# Patient Record
Sex: Female | Born: 2000 | Race: White | Hispanic: No | Marital: Single
Health system: Northeastern US, Academic
[De-identification: ages and names within clinical notes are randomized; demographics above are authoritative.]

## PROBLEM LIST (undated history)

## (undated) DIAGNOSIS — F909 Attention-deficit hyperactivity disorder, unspecified type: Secondary | ICD-10-CM

## (undated) DIAGNOSIS — F4310 Post-traumatic stress disorder, unspecified: Secondary | ICD-10-CM

## (undated) DIAGNOSIS — Z789 Other specified health status: Secondary | ICD-10-CM

## (undated) DIAGNOSIS — F1090 Alcohol use, unspecified, uncomplicated: Secondary | ICD-10-CM

## (undated) DIAGNOSIS — F319 Bipolar disorder, unspecified: Secondary | ICD-10-CM

---
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
## 2022-05-04 ENCOUNTER — Encounter
Admit: 2022-05-04 | Payer: PRIVATE HEALTH INSURANCE | Attending: Student in an Organized Health Care Education/Training Program | Primary: Student in an Organized Health Care Education/Training Program

## 2022-05-04 ENCOUNTER — Ambulatory Visit
Admit: 2022-05-04 | Payer: Managed Care (Private) | Attending: Student in an Organized Health Care Education/Training Program | Primary: Student in an Organized Health Care Education/Training Program

## 2022-05-04 MED ORDER — ESCITALOPRAM 20 MG TABLET
20 | ORAL | 2.00 refills | 90.00000 days | Status: AC
Start: 2022-05-04 — End: ?

## 2022-05-04 MED ORDER — BUPROPION HCL XL 150 MG 24 HR TABLET, EXTENDED RELEASE
150 | ORAL | 2.00 refills | 90.00000 days | Status: AC
Start: 2022-05-04 — End: ?

## 2022-05-04 MED ORDER — VYVANSE 60 MG CAPSULE
60 | ORAL | 1.00 refills | 30.00000 days | Status: AC
Start: 2022-05-04 — End: 2023-01-08

## 2022-05-04 MED ORDER — ARIPIPRAZOLE 5 MG TABLET
5 | ORAL | 1.00 refills | 30.00000 days | Status: AC
Start: 2022-05-04 — End: 2023-01-08

## 2022-05-05 ENCOUNTER — Inpatient Hospital Stay
Admit: 2022-05-05 | Discharge: 2022-05-05 | Payer: PRIVATE HEALTH INSURANCE | Primary: Student in an Organized Health Care Education/Training Program

## 2022-05-05 DIAGNOSIS — Z13220 Encounter for screening for lipoid disorders: Secondary | ICD-10-CM

## 2022-05-05 DIAGNOSIS — Z0189 Encounter for other specified special examinations: Secondary | ICD-10-CM

## 2022-05-05 DIAGNOSIS — Z0000 Encounter for general adult medical examination without abnormal findings: Secondary | ICD-10-CM

## 2022-05-05 DIAGNOSIS — Z8639 Personal history of other endocrine, nutritional and metabolic disease: Secondary | ICD-10-CM

## 2022-05-05 DIAGNOSIS — E612 Magnesium deficiency: Secondary | ICD-10-CM

## 2022-05-05 LAB — COMPREHENSIVE METABOLIC PANEL
BKR A/G RATIO: 1.1
BKR ALANINE AMINOTRANSFERASE (ALT): 31 U/L (ref 12–78)
BKR ALBUMIN: 3.9 g/dL (ref 3.4–5.0)
BKR ALKALINE PHOSPHATASE: 49 U/L — ABNORMAL LOW (ref 50–335)
BKR ANION GAP: 6 (ref 5–18)
BKR ASPARTATE AMINOTRANSFERASE (AST): 69 U/L — ABNORMAL HIGH (ref 5–37)
BKR AST/ALT RATIO: 2.2
BKR BILIRUBIN TOTAL: 0.6 mg/dL (ref 0.0–1.0)
BKR BLOOD UREA NITROGEN: 16 mg/dL (ref 8–25)
BKR BUN / CREAT RATIO: 16.2 % (ref 8.0–25.0)
BKR CALCIUM: 9.1 mg/dL (ref 9.1–10.3)
BKR CHLORIDE: 109 mmol/L — ABNORMAL HIGH (ref 95–115)
BKR CO2: 27 mmol/L (ref 21–32)
BKR CREATININE: 0.99 mg/dL (ref 0.50–1.30)
BKR EGFR, CREATININE (CKD-EPI 2021): >60 mL/min/{1.73_m2} (ref >=60)
BKR GLUCOSE: 76 mg/dL (ref 70–100)
BKR OSMOLALITY CALCULATION: 283 mosm/kg (ref 275–295)
BKR POTASSIUM: 4.3 mmol/L (ref 3.5–5.1)
BKR PROTEIN TOTAL: 7.6 g/dL (ref 6.4–8.2)
BKR SODIUM: 142 mmol/L (ref 136–145)
BKR WAM BASOPHIL ABSOLUTE COUNT.: 283 mOsm/kg (ref 275–295)

## 2022-05-05 LAB — LIPID PANEL
BKR CHOLESTEROL/HDL RATIO: 2.4 (ref 1.0–3.9)
BKR CHOLESTEROL: 179 mg/dL — ABNORMAL HIGH (ref 125–175)
BKR HDL CHOLESTEROL: 75 mg/dL (ref 40–90)
BKR LDL CHOLESTEROL SAMPSON CALCULATED: 87 mg/dL (ref 20–130)
BKR TRIGLYCERIDES: 95 mg/dL (ref 30–200)

## 2022-05-05 LAB — CBC WITH AUTO DIFFERENTIAL
BKR GLOBULIN: 1.16 x 1000/??L (ref 0.60–3.70)
BKR WAM ABSOLUTE IMMATURE GRANULOCYTES.: 0.02 x 1000/??L (ref 0.00–0.30)
BKR WAM ABSOLUTE LYMPHOCYTE COUNT.: 1.16 x 1000/ÂµL (ref 0.60–3.70)
BKR WAM ABSOLUTE NRBC (2 DEC): 0 x 1000/??L (ref 0.00–1.00)
BKR WAM ANALYZER ANC: 3.07 x 1000/ÂµL (ref 2.00–7.60)
BKR WAM BASOPHILS: 0.4 % (ref 0.0–1.4)
BKR WAM EOSINOPHIL ABSOLUTE COUNT.: 0.04 x 1000/??L (ref 0.00–1.00)
BKR WAM EOSINOPHILS: 0.8 % (ref 0.0–5.0)
BKR WAM HEMATOCRIT (2 DEC): 41.3 % (ref 35.00–45.00)
BKR WAM HEMOGLOBIN: 14 g/dL (ref 11.7–15.5)
BKR WAM IMMATURE GRANULOCYTES: 0.4 % (ref 0.0–1.0)
BKR WAM LYMPHOCYTES: 24.5 % (ref 17.0–50.0)
BKR WAM MCH (PG): 34.4 pg — ABNORMAL HIGH (ref 27.0–33.0)
BKR WAM MCHC: 33.9 g/dL (ref 31.0–36.0)
BKR WAM MCV: 101.5 fL — ABNORMAL HIGH (ref 80.0–100.0)
BKR WAM MONOCYTE ABSOLUTE COUNT.: 0.42 x 1000/??L (ref 0.00–1.00)
BKR WAM MONOCYTES: 8.9 % (ref 4.0–12.0)
BKR WAM MPV: 10.3 fL (ref 8.0–12.0)
BKR WAM NEUTROPHILS: 65 % (ref 39.0–72.0)
BKR WAM NUCLEATED RED BLOOD CELLS: 0 % (ref 0.0–1.0)
BKR WAM PLATELETS: 248 x1000/ÂµL (ref 150–420)
BKR WAM RDW-CV: 11.7 % (ref 11.0–15.0)
BKR WAM RED BLOOD CELL COUNT.: 4.07 M/??L (ref 4.00–6.00)
BKR WAM WHITE BLOOD CELL COUNT: 4.7 x1000/??L (ref 4.0–11.0)

## 2022-05-05 LAB — IRON AND TIBC
BKR IRON SATURATION (SCCCT BH GH LM): 41 % (ref 11–46)
BKR IRON: 140 ug/dL (ref 40–150)
BKR TOTAL IRON BINDING CAPACITY (SCCCT  BH GH LM): 344 ug/dL — ABNORMAL HIGH (ref 250–450)

## 2022-05-05 LAB — MAGNESIUM: BKR MAGNESIUM: 2.2 mg/dL (ref 1.4–2.2)

## 2022-05-05 LAB — FERRITIN: BKR FERRITIN: 54 ng/mL (ref 8–252)

## 2022-05-05 LAB — HEMOGLOBIN A1C: BKR HEMOGLOBIN A1C: 4.9 %

## 2022-05-05 LAB — TSH W/REFLEX TO FT4     (BH GH LMW Q YH): BKR THYROID STIMULATING HORMONE: 2.17 ??IU/mL (ref 0.358–3.740)

## 2022-05-09 ENCOUNTER — Encounter
Admit: 2022-05-09 | Payer: PRIVATE HEALTH INSURANCE | Attending: Student in an Organized Health Care Education/Training Program | Primary: Student in an Organized Health Care Education/Training Program

## 2022-05-09 NOTE — Other
Elevated AST. Repeat in 2 months. Otherwise normal labs.

## 2022-06-23 ENCOUNTER — Other Ambulatory Visit: Payer: Self-pay

## 2022-06-23 ENCOUNTER — Observation Stay
Admission: EM | Admit: 2022-06-23 | Discharge: 2022-06-24 | Disposition: A | Discharge status: Home / Self Care | Payer: BC Managed Care – PPO | Attending: Internal Medicine | Admitting: Internal Medicine

## 2022-06-23 DIAGNOSIS — F4310 Post-traumatic stress disorder, unspecified: Secondary | ICD-10-CM | POA: Diagnosis present

## 2022-06-23 DIAGNOSIS — F1090 Alcohol use, unspecified, uncomplicated: Secondary | ICD-10-CM | POA: Diagnosis not present

## 2022-06-23 DIAGNOSIS — T50901A Poisoning by unspecified drugs, medicaments and biological substances, accidental (unintentional), initial encounter: Secondary | ICD-10-CM | POA: Diagnosis present

## 2022-06-23 DIAGNOSIS — Z79899 Other long term (current) drug therapy: Secondary | ICD-10-CM | POA: Diagnosis not present

## 2022-06-23 DIAGNOSIS — F411 Generalized anxiety disorder: Secondary | ICD-10-CM | POA: Diagnosis not present

## 2022-06-23 DIAGNOSIS — Z1152 Encounter for screening for COVID-19: Secondary | ICD-10-CM | POA: Insufficient documentation

## 2022-06-23 DIAGNOSIS — F909 Attention-deficit hyperactivity disorder, unspecified type: Secondary | ICD-10-CM | POA: Diagnosis not present

## 2022-06-23 DIAGNOSIS — T43222A Poisoning by selective serotonin reuptake inhibitors, intentional self-harm, initial encounter: Secondary | ICD-10-CM | POA: Diagnosis present

## 2022-06-23 DIAGNOSIS — T43291A Poisoning by other antidepressants, accidental (unintentional), initial encounter: Secondary | ICD-10-CM | POA: Diagnosis not present

## 2022-06-23 DIAGNOSIS — T50902A Poisoning by unspecified drugs, medicaments and biological substances, intentional self-harm, initial encounter: Secondary | ICD-10-CM | POA: Insufficient documentation

## 2022-06-23 DIAGNOSIS — F319 Bipolar disorder, unspecified: Secondary | ICD-10-CM | POA: Diagnosis not present

## 2022-06-23 DIAGNOSIS — Y906 Blood alcohol level of 120-199 mg/100 ml: Secondary | ICD-10-CM | POA: Insufficient documentation

## 2022-06-23 DIAGNOSIS — E876 Hypokalemia: Secondary | ICD-10-CM | POA: Diagnosis not present

## 2022-06-23 DIAGNOSIS — Z789 Other specified health status: Secondary | ICD-10-CM | POA: Diagnosis present

## 2022-06-23 DIAGNOSIS — T43292A Poisoning by other antidepressants, intentional self-harm, initial encounter: Secondary | ICD-10-CM | POA: Insufficient documentation

## 2022-06-23 DIAGNOSIS — F332 Major depressive disorder, recurrent severe without psychotic features: Secondary | ICD-10-CM | POA: Diagnosis not present

## 2022-06-23 DIAGNOSIS — F3132 Bipolar disorder, current episode depressed, moderate: Secondary | ICD-10-CM | POA: Diagnosis present

## 2022-06-23 DIAGNOSIS — F314 Bipolar disorder, current episode depressed, severe, without psychotic features: Secondary | ICD-10-CM | POA: Diagnosis present

## 2022-06-23 HISTORY — DX: Attention-deficit hyperactivity disorder, unspecified type: F90.9

## 2022-06-23 HISTORY — DX: Bipolar disorder, unspecified: F31.9

## 2022-06-23 HISTORY — DX: Other specified health status: Z78.9

## 2022-06-23 HISTORY — DX: Alcohol use, unspecified, uncomplicated: F10.90

## 2022-06-23 HISTORY — DX: Post-traumatic stress disorder, unspecified: F43.10

## 2022-06-23 LAB — URINE DRUG SCREEN, QUALITATIVE (ARMC ONLY)
Amphetamines, Ur Screen: NOT DETECTED
Barbiturates, Ur Screen: NOT DETECTED
Benzodiazepine, Ur Scrn: NOT DETECTED
Cannabinoid 50 Ng, Ur ~~LOC~~: NOT DETECTED
Cocaine Metabolite,Ur ~~LOC~~: NOT DETECTED
MDMA (Ecstasy)Ur Screen: NOT DETECTED
Methadone Scn, Ur: NOT DETECTED
Opiate, Ur Screen: NOT DETECTED
Phencyclidine (PCP) Ur S: NOT DETECTED
Tricyclic, Ur Screen: NOT DETECTED

## 2022-06-23 LAB — URINALYSIS, ROUTINE W REFLEX MICROSCOPIC
Bilirubin Urine: NEGATIVE
Glucose, UA: NEGATIVE mg/dL
Hgb urine dipstick: NEGATIVE
Ketones, ur: NEGATIVE mg/dL
Leukocytes,Ua: NEGATIVE
Nitrite: NEGATIVE
Protein, ur: NEGATIVE mg/dL
Specific Gravity, Urine: 1.001 — ABNORMAL LOW (ref 1.005–1.030)
pH: 6 (ref 5.0–8.0)

## 2022-06-23 LAB — ACETAMINOPHEN LEVEL
Acetaminophen (Tylenol), Serum: <10 ug/mL — ABNORMAL LOW (ref 10–30)
Acetaminophen (Tylenol), Serum: <10 ug/mL — ABNORMAL LOW (ref 10–30)

## 2022-06-23 LAB — COMPREHENSIVE METABOLIC PANEL
ALT: 14 U/L (ref 0–44)
AST: 26 U/L (ref 15–41)
Albumin: 4.4 g/dL (ref 3.5–5.0)
Alkaline Phosphatase: 40 U/L (ref 38–126)
Anion gap: 10 (ref 5–15)
BUN: 13 mg/dL (ref 6–20)
CO2: 22 mmol/L (ref 22–32)
Calcium: 9.2 mg/dL (ref 8.9–10.3)
Chloride: 107 mmol/L (ref 98–111)
Creatinine, Ser: 0.89 mg/dL (ref 0.44–1.00)
GFR, Estimated: >60 mL/min (ref >60)
Glucose, Bld: 94 mg/dL (ref 70–99)
Potassium: 3.4 mmol/L — ABNORMAL LOW (ref 3.5–5.1)
Sodium: 139 mmol/L (ref 135–145)
Total Bilirubin: 0.4 mg/dL (ref 0.3–1.2)
Total Protein: 7.8 g/dL (ref 6.5–8.1)

## 2022-06-23 LAB — SALICYLATE LEVEL: Salicylate Lvl: <7 mg/dL — ABNORMAL LOW (ref 7.0–30.0)

## 2022-06-23 LAB — MAGNESIUM: Magnesium: 2.2 mg/dL (ref 1.7–2.4)

## 2022-06-23 LAB — CBC WITH DIFFERENTIAL/PLATELET
Abs Immature Granulocytes: 0.04 10*3/uL (ref 0.00–0.07)
Basophils Absolute: 0.1 10*3/uL (ref 0.0–0.1)
Basophils Relative: 1 %
Eosinophils Absolute: 0.1 10*3/uL (ref 0.0–0.5)
Eosinophils Relative: 1 %
HCT: 40.3 % (ref 36.0–46.0)
Hemoglobin: 13.6 g/dL (ref 12.0–15.0)
Immature Granulocytes: 1 %
Lymphocytes Relative: 27 %
Lymphs Abs: 2.2 10*3/uL (ref 0.7–4.0)
MCH: 34.2 pg — ABNORMAL HIGH (ref 26.0–34.0)
MCHC: 33.7 g/dL (ref 30.0–36.0)
MCV: 101.3 fL — ABNORMAL HIGH (ref 80.0–100.0)
Monocytes Absolute: 0.6 10*3/uL (ref 0.1–1.0)
Monocytes Relative: 7 %
Neutro Abs: 5.1 10*3/uL (ref 1.7–7.7)
Neutrophils Relative %: 63 %
Platelets: 263 10*3/uL (ref 150–400)
RBC: 3.98 MIL/uL (ref 3.87–5.11)
RDW: 11.6 % (ref 11.5–15.5)
WBC: 8 10*3/uL (ref 4.0–10.5)
nRBC: 0 % (ref 0.0–0.2)

## 2022-06-23 LAB — ETHANOL: Alcohol, Ethyl (B): 191 mg/dL — ABNORMAL HIGH (ref <10)

## 2022-06-23 LAB — RESP PANEL BY RT-PCR (FLU A&B, COVID) ARPGX2
Influenza A by PCR: NEGATIVE
Influenza B by PCR: NEGATIVE
SARS Coronavirus 2 by RT PCR: NEGATIVE

## 2022-06-23 LAB — POC URINE PREG, ED: Preg Test, Ur: NEGATIVE

## 2022-06-23 MED ORDER — INFLUENZA VAC SPLIT QUAD 0.5 ML IM SUSY: 0.5000 mL | PREFILLED_SYRINGE | INTRAMUSCULAR | Status: DC

## 2022-06-23 MED ORDER — ONDANSETRON HCL 4 MG/2ML IJ SOLN: 4.0000 mg | Freq: Three times a day (TID) | INTRAMUSCULAR | Status: DC | PRN

## 2022-06-23 MED ORDER — LORAZEPAM 2 MG/ML IJ SOLN: 1.0000 mg | INTRAMUSCULAR | Status: DC | PRN

## 2022-06-23 MED ORDER — IBUPROFEN 400 MG PO TABS: 200.0000 mg | ORAL_TABLET | Freq: Four times a day (QID) | ORAL | Status: DC | PRN

## 2022-06-23 MED ORDER — LORAZEPAM 2 MG/ML IJ SOLN: 0.0000 mg | Freq: Four times a day (QID) | INTRAMUSCULAR | Status: DC

## 2022-06-23 MED ORDER — THIAMINE HCL 100 MG/ML IJ SOLN: 100.0000 mg | Freq: Every day | INTRAMUSCULAR | Status: DC

## 2022-06-23 MED ORDER — CHARCOAL ACTIVATED PO LIQD
50.0000 g | Freq: Once | ORAL | Status: AC
  Administered 2022-06-23: 50 g via ORAL
  Filled 2022-06-23: qty 240

## 2022-06-23 MED ORDER — ADULT MULTIVITAMIN W/MINERALS CH
1.0000 | ORAL_TABLET | Freq: Every day | ORAL | Status: DC
  Administered 2022-06-24: 1 via ORAL
  Filled 2022-06-23: qty 1

## 2022-06-23 MED ORDER — POTASSIUM CHLORIDE CRYS ER 20 MEQ PO TBCR
60.0000 meq | EXTENDED_RELEASE_TABLET | Freq: Once | ORAL | Status: AC
  Administered 2022-06-23: 60 meq via ORAL
  Filled 2022-06-23: qty 3

## 2022-06-23 MED ORDER — LORAZEPAM 2 MG/ML IJ SOLN: 0.0000 mg | Freq: Two times a day (BID) | INTRAMUSCULAR | Status: DC

## 2022-06-23 MED ORDER — THIAMINE MONONITRATE 100 MG PO TABS
100.0000 mg | ORAL_TABLET | Freq: Every day | ORAL | Status: DC
  Administered 2022-06-23 – 2022-06-24 (×2): 100 mg via ORAL
  Filled 2022-06-23 (×2): qty 1

## 2022-06-23 MED ORDER — FOLIC ACID 1 MG PO TABS
1.0000 mg | ORAL_TABLET | Freq: Every day | ORAL | Status: DC
  Administered 2022-06-24: 1 mg via ORAL
  Filled 2022-06-23: qty 1

## 2022-06-23 MED ORDER — LACTATED RINGERS IV BOLUS
1000.0000 mL | Freq: Once | INTRAVENOUS | Status: AC
  Administered 2022-06-23: 1000 mL via INTRAVENOUS

## 2022-06-23 MED ORDER — LORAZEPAM 1 MG PO TABS: 1.0000 mg | ORAL_TABLET | ORAL | Status: DC | PRN

## 2022-06-23 MED ORDER — ENOXAPARIN SODIUM 40 MG/0.4ML IJ SOSY: 40.0000 mg | PREFILLED_SYRINGE | INTRAMUSCULAR | Status: DC

## 2022-06-23 NOTE — ED Provider Notes (Signed)
[facility] Provider Note    Event Date/Time   First MD Initiated Contact with Patient 06/23/22 0327     (approximate)   History   Suicide Attempt   HPI  Denise Jordan is a 21 y.o. female who presents to the ED for evaluation of Suicide Attempt   Patient presents to the ED for evaluation of a suicide attempt.  She reports having a lot of psychosocial stressors, she is at [facility].  She reports taking 10-15 tablets of 150 mg Wellbutrin as well as 5 tablets of 20 mg Lexapro.  Also reports some ethanol without other recreational drugs.   Physical Exam   Triage Vital Signs: ED Triage Vitals [06/23/22 0330]  Enc Vitals Group     BP      Pulse      Resp      Temp      Temp src      SpO2 99 %     Weight      Height      Head Circumference      Peak Flow      Pain Score      Pain Loc      Pain Edu?      Excl. in GC?     Most recent vital signs: Vitals:   06/23/22 0430 06/23/22 0500  BP: 121/75 (!) 108/59  Pulse: 92 (!) 103  Resp: 15 17  SpO2: 99% 99%    General: Awake, no distress.  Flat affect, somewhat sleepy but awake and conversational.  Pleasant. CV:  Good peripheral perfusion.  Resp:  Normal effort.  Abd:  No distention.  MSK:  No deformity noted.  Neuro:  No focal deficits appreciated. Other:     ED Results / Procedures / Treatments   Labs (all labs ordered are listed, but only abnormal results are displayed) Labs Reviewed  COMPREHENSIVE METABOLIC PANEL - Abnormal; Notable for the following components:      Result Value   Potassium 3.4 (*)    All other components within normal limits  ETHANOL - Abnormal; Notable for the following components:   Alcohol, Ethyl (B) 191 (*)    All other components within normal limits  CBC WITH DIFFERENTIAL/PLATELET - Abnormal; Notable for the following components:   MCV 101.3 (*)    MCH 34.2 (*)    All other components within normal limits  SALICYLATE LEVEL - Abnormal; Notable for the  following components:   Salicylate Lvl <7.0 (*)    All other components within normal limits  ACETAMINOPHEN LEVEL - Abnormal; Notable for the following components:   Acetaminophen (Tylenol), Serum <10 (*)    All other components within normal limits  URINALYSIS, ROUTINE W REFLEX MICROSCOPIC - Abnormal; Notable for the following components:   Color, Urine COLORLESS (*)    APPearance CLEAR (*)    Specific Gravity, Urine 1.001 (*)    All other components within normal limits  RESP PANEL BY RT-PCR (FLU A&B, COVID) ARPGX2  URINE DRUG SCREEN, QUALITATIVE ([facility] ONLY)  MAGNESIUM  POC URINE PREG, ED    EKG Sinus rhythm with a rate of 100 bpm.  Normal axis and intervals.  Poor quality lateral leads.  No clear signs of acute ischemia.  RADIOLOGY   Official radiology report(s): No results found.  PROCEDURES and INTERVENTIONS:  .Critical Care  Performed by: Delton Prairie, MD Authorized by: Delton Prairie, MD   Critical care provider statement:    Critical care time (  minutes):  30   Critical care time was exclusive of:  Separately billable procedures and treating other patients   Critical care was necessary to treat or prevent imminent or life-threatening deterioration of the following conditions:  Toxidrome   Critical care was time spent personally by me on the following activities:  Development of treatment plan with patient or surrogate, discussions with consultants, evaluation of patient's response to treatment, examination of patient, ordering and review of laboratory studies, ordering and review of radiographic studies, ordering and performing treatments and interventions, pulse oximetry, re-evaluation of patient's condition and review of old charts   Medications  charcoal activated (NO SORBITOL) (ACTIDOSE-AQUA) suspension 50 g (50 g Oral Given 06/23/22 0356)  lactated ringers bolus 1,000 mL (0 mLs Intravenous Stopped 06/23/22 0529)     IMPRESSION / MDM / [place] / ED  COURSE  I reviewed the triage vital signs and the nursing notes.  Differential diagnosis includes, but is not limited to, polysubstance abuse, overdose, malingering, assault  {Patient presents with symptoms of an acute illness or injury that is potentially life-threatening.  21 year old girl presents with continued suicidality after intentionally overdosing on Wellbutrin and SSRI.  She is requiring medical admission while under IVC for continued observation considering this overdose.  She is somewhat sleepy for me, but looks okay overall.  No other particular toxidromes, distress, signs of serotonin syndrome.  Blood work is reassuring with normal CBC, metabolic panel.  Ethanol is noted to be slightly elevated, UDS is normal.  We consulted poison control who recommends 24-hour observation, so we will consult with medicine. Placed under IVC      FINAL CLINICAL IMPRESSION(S) / ED DIAGNOSES   Final diagnoses:  Intentional overdose, initial encounter [facility])     Rx / DC Orders   ED Discharge Orders     None        Note:  This document was prepared using Dragon voice recognition software and may include unintentional dictation errors.   Vladimir Crofts, MD 06/23/22 [phone]

## 2022-06-23 NOTE — ED Triage Notes (Signed)
Patient arrived by [facility] from [place] for suicide attempt of over dose 15-150 mg Wellbutrin 5-20 mg Lexapro. Patient states she has been under a lot of stress and was out with friends at [place] parties and became upset.  Patient states she had a couple of drinks at the parties.

## 2022-06-23 NOTE — H&P (Signed)
History and Physical    [name] [id number] DOB: 09-05-2000 DOA: 06/23/2022  Referring MD/NP/PA:   PCP: Pcp, No   Patient coming from:  The patient is coming from home.  At baseline, pt is independent for most of ADL.        Chief Complaint: overdose  HPI: Denise Jordan is a 21 y.o. female with medical history significant of ADHD, bipolar I disorder ([place]), GAD (generalized anxiety disorder, MDD (major depressive disorder), PTSD (post-traumatic stress disorder), alcohol use, who presents with overdose.  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  She has tremendous psychosocial stress recently. She reports taking 10-15 tablets of 150 mg Wellbutrin as well as 5 tablets of 20 mg Lexapro.  She denies suicidal homicidal ideations to me.  Patient does not have chest pain, cough, shortness breath.  No nausea vomiting, diarrhea or abdominal pain.  No symptoms of UTI.  She states that she drinks vodka, 3 times per week, 1 to 2 glasses of vodka each time.   Data reviewed independently and ED Course: pt was found to have WBC 8.0, negative UDS, negative pregnancy test, Tylenol level less than 0000000, salicylate level less than 7, negative urinalysis, negative COVID PCR, alcohol level 191, potassium 3.4, GFR> 60, magnesium 2.2.  Blood pressure 101/52, heart rate 107, RR 16, oxygen saturation 95% on room air.  Patient is placed on telemetry bed for observation.  Psychiatry NP, Caroline Sauger is consulted.   EKG: I have personally reviewed.  Sinus rhythm, QTc 431, nonspecific T wave change.  Repeated EKG showed QTc 440.   Review of Systems:   General: no fevers, chills, no body weight gain, fatigue HEENT: no blurry vision, hearing changes or sore throat Respiratory: no dyspnea, coughing, wheezing CV: no chest pain, no palpitations GI: no nausea, vomiting, abdominal pain, diarrhea, constipation GU: no dysuria, burning on urination, increased urinary frequency, hematuria  Ext: no  leg edema Neuro: no unilateral weakness, numbness, or tingling, no vision change or hearing loss Skin: no rash, no skin tear. MSK: No muscle spasm, no deformity, no limitation of range of movement in spin Heme: No easy bruising.  Travel history: No recent long distant travel. Psychiatry:  denies suicidal homicidal ideations currently.   Allergy: No Known Allergies  Past Medical History:  Diagnosis Date   ADHD    Alcohol use    Bipolar disorder ([place])    PTSD (post-traumatic stress disorder)     History reviewed. No pertinent surgical history.  Social History:  reports that she has never smoked. She has never used smokeless tobacco. She reports current alcohol use. She reports that she does not use drugs.  Family History:  Family History  Problem Relation Age of Onset   Stroke Mother    Kidney cancer Father      Prior to Admission medications   Medication Sig Start Date End Date Taking? Authorizing Provider  amphetamine-dextroamphetamine (ADDERALL XR) 30 MG 24 hr capsule Take 30 mg by mouth every morning. 05/11/22  Yes [provider]  ARIPiprazole (ABILIFY) 5 MG tablet Take 5 mg by mouth daily. 06/19/22  Yes [provider]  buPROPion (WELLBUTRIN XL) 150 MG 24 hr tablet Take 150 mg by mouth daily. 06/19/22  Yes [provider]  escitalopram (LEXAPRO) 20 MG tablet Take 20 mg by mouth daily. 06/20/22  Yes [provider]    Physical Exam: Vitals:   06/23/22 1200 06/23/22 1230 06/23/22 1300 06/23/22 1534  BP: 101/61 (!) 101/59  101/66 112/64  Pulse: 79 98 82 92  Resp: 15 19 12 16   Temp:    98.5 F (36.9 C)  TempSrc:    Oral  SpO2: 95% 98% 99% 99%  Weight:      Height:       General: Not in acute distress HEENT:       Eyes: PERRL, EOMI, no scleral icterus.       ENT: No discharge from the ears and nose, no pharynx injection, no tonsillar enlargement.        Neck: No JVD, no bruit, no mass felt. Heme: No neck lymph node  enlargement. Cardiac: S1/S2, RRR, No murmurs, No gallops or rubs. Respiratory: No rales, wheezing, rhonchi or rubs. GI: Soft, nondistended, nontender, no rebound pain, no organomegaly, BS present. GU: No hematuria Ext: No pitting leg edema bilaterally. 1+DP/PT pulse bilaterally. Musculoskeletal: No joint deformities, No joint redness or warmth, no limitation of ROM in spin. Skin: No rashes.  Neuro: Alert, oriented X3, cranial nerves II-XII grossly intact, moves all extremities normally.  Psych: no suicidal or hemocidal ideation. Pt is calm now.  Labs on Admission: I have personally reviewed following labs and imaging studies  CBC: Recent Labs  Lab 06/23/22 0417  WBC 8.0  NEUTROABS 5.1  HGB 13.6  HCT 40.3  MCV 101.3*  PLT 99991111   Basic Metabolic Panel: Recent Labs  Lab 06/23/22 0417  NA 139  K 3.4*  CL 107  CO2 22  GLUCOSE 94  BUN 13  CREATININE 0.89  CALCIUM 9.2  MG 2.2   GFR: Estimated Creatinine Clearance: 108.1 mL/min (by C-G formula based on SCr of 0.89 mg/dL). Liver Function Tests: Recent Labs  Lab 06/23/22 0417  AST 26  ALT 14  ALKPHOS 40  BILITOT 0.4  PROT 7.8  ALBUMIN 4.4   No results for input(s): "LIPASE", "AMYLASE" in the last 168 hours. No results for input(s): "AMMONIA" in the last 168 hours. Coagulation Profile: No results for input(s): "INR", "PROTIME" in the last 168 hours. Cardiac Enzymes: No results for input(s): "CKTOTAL", "CKMB", "CKMBINDEX", "TROPONINI" in the last 168 hours. BNP (last 3 results) No results for input(s): "PROBNP" in the last 8760 hours. HbA1C: No results for input(s): "HGBA1C" in the last 72 hours. CBG: No results for input(s): "GLUCAP" in the last 168 hours. Lipid Profile: No results for input(s): "CHOL", "HDL", "LDLCALC", "TRIG", "CHOLHDL", "LDLDIRECT" in the last 72 hours. Thyroid Function Tests: No results for input(s): "TSH", "T4TOTAL", "FREET4", "T3FREE", "THYROIDAB" in the last 72 hours. Anemia Panel: No  results for input(s): "VITAMINB12", "FOLATE", "FERRITIN", "TIBC", "IRON", "RETICCTPCT" in the last 72 hours. Urine analysis:    Component Value Date/Time   COLORURINE COLORLESS (A) 06/23/2022 0350   APPEARANCEUR CLEAR (A) 06/23/2022 0350   LABSPEC 1.001 (L) 06/23/2022 0350   PHURINE 6.0 06/23/2022 0350   GLUCOSEU NEGATIVE 06/23/2022 0350   HGBUR NEGATIVE 06/23/2022 0350   BILIRUBINUR NEGATIVE 06/23/2022 0350   KETONESUR NEGATIVE 06/23/2022 0350   PROTEINUR NEGATIVE 06/23/2022 0350   NITRITE NEGATIVE 06/23/2022 0350   LEUKOCYTESUR NEGATIVE 06/23/2022 0350   Sepsis Labs: @LABRCNTIP (procalcitonin:4,lacticidven:4) ) Recent Results (from the past 240 hour(s))  Resp Panel by RT-PCR (Flu A&B, Covid) Anterior Nasal Swab     Status: None   Collection Time: 06/23/22  4:17 AM   Specimen: Anterior Nasal Swab  Result Value Ref Range Status   SARS Coronavirus 2 by RT PCR NEGATIVE NEGATIVE Final    Comment: (NOTE) SARS-CoV-2 target nucleic acids are NOT  DETECTED.  The SARS-CoV-2 RNA is generally detectable in upper respiratory specimens during the acute phase of infection. The lowest concentration of SARS-CoV-2 viral copies this assay can detect is 138 copies/mL. A negative result does not preclude SARS-Cov-2 infection and should not be used as the sole basis for treatment or other patient management decisions. A negative result may occur with  improper specimen collection/handling, submission of specimen other than nasopharyngeal swab, presence of viral mutation(s) within the areas targeted by this assay, and inadequate number of viral copies(<138 copies/mL). A negative result must be combined with clinical observations, patient history, and epidemiological information. The expected result is Negative.  Fact Sheet for Patients:  EntrepreneurPulse.com.au  Fact Sheet for Healthcare Providers:  IncredibleEmployment.be  This test is no t yet approved or  cleared by the [place] FDA and  has been authorized for detection and/or diagnosis of SARS-CoV-2 by FDA under an Emergency Use Authorization (EUA). This EUA will remain  in effect (meaning this test can be used) for the duration of the COVID-19 declaration under Section 564(b)(1) of the Act, 21 U.S.C.section 360bbb-3(b)(1), unless the authorization is terminated  or revoked sooner.       Influenza A by PCR NEGATIVE NEGATIVE Final   Influenza B by PCR NEGATIVE NEGATIVE Final    Comment: (NOTE) The Xpert Xpress SARS-CoV-2/FLU/RSV plus assay is intended as an aid in the diagnosis of influenza from Nasopharyngeal swab specimens and should not be used as a sole basis for treatment. Nasal washings and aspirates are unacceptable for Xpert Xpress SARS-CoV-2/FLU/RSV testing.  Fact Sheet for Patients: EntrepreneurPulse.com.au  Fact Sheet for Healthcare Providers: IncredibleEmployment.be  This test is not yet approved or cleared by the [place] FDA and has been authorized for detection and/or diagnosis of SARS-CoV-2 by FDA under an Emergency Use Authorization (EUA). This EUA will remain in effect (meaning this test can be used) for the duration of the COVID-19 declaration under Section 564(b)(1) of the Act, 21 U.S.C. section 360bbb-3(b)(1), unless the authorization is terminated or revoked.  Performed at [facility], [address], [place], [place] [address]      Radiological Exams on Admission: No results found.    Assessment/Plan Principal Problem:   Overdose Active Problems:   Attention deficit hyperactivity disorder (ADHD)   Bipolar I disorder (H[facility])   GAD (generalized anxiety disorder)   MDD (major depressive disorder), recurrent episode, severe ([facility])   PTSD (post-traumatic stress disorder)   Alcohol use   Hypokalemia   Assessment and Plan:  Overdose: [facility] poison control contacted by ED. "Per Patty at poison  control as follows: 24 cardiac monitor EKG q8 hrs Charcoal PO Monitor for seizure, QRS widening, and QTC elongation If seizure occur treat with Benzo For QRS widening give 120 amps Bicarb bolus For QTC elongation over 500 replace Potassium to 4 and Mag to 2  Recheck tylenol between 0700-0800 --> < 10 x2  They also recommended: "pt be monitored until 3am at least and must have normal ekg before discharge. They said to watch for seizure and Qt prolongation. They recommended no antiemetics or antipsychotics since they would prolong QTC. They recommended benzos for any agitation or seizures. Phenobarbital second line for seizures. They recommended to check Mg level if patient begins to have seizures or if Qtc goes above 480"  -place in tele bed for obs -Patient was given 1 dose of charcoal activated 50 g in ED -EKG q6h for monitoring QTc -Suicidal precaution - Psych NP, [place], Geni Bers  Attention deficit hyperactivity disorder (ADHD), Bipolar I disorder ([place]), GAD (generalized anxiety disorder), MDD (major depressive disorder), recurrent episode, severe ([place]), PTSD -Hold all home medications, including Abilify, Wellbutrin, Lexapro, Adderall, since patient may also overdosed to those medications. -f/u psychiatrist recommendations -As needed Ativan for anxiety  Alcohol use: No signs of withdrawal currently -CIWA protocol -Did counseling about importance of quitting alcohol use  Hypokalemia: Potassium 3.4.  Magnesium 2.2 -Repleted potassium       DVT ppx:SQ Lovenox  Code Status: Full code  Family Communication:   Yes, patient's mother by phone  Disposition Plan:  To be determined, likely need to go to [facility] unit.   Consults called:  psych NP, Ardeth Sportsman  Admission status and Level of care: Telemetry Medical:  for obs     Dispo: The patient is from: Home              Anticipated d/c is to:  likely need to go [facility] unit              Anticipated d/c date is: 1 day               Patient currently is not medically stable to d/c.    Severity of Illness:  The appropriate patient status for this patient is OBSERVATION. Observation status is judged to be reasonable and necessary in order to provide the required intensity of service to ensure the patient's safety. The patient's presenting symptoms, physical exam findings, and initial radiographic and laboratory data in the context of their medical condition is felt to place them at decreased risk for further clinical deterioration. Furthermore, it is anticipated that the patient will be medically stable for discharge from the hospital within 2 midnights of admission.        Date of Service 06/23/2022    [facility]   If 7PM-7AM, please contact night-coverage [email or web address] 06/23/2022, 6:20 PM

## 2022-06-23 NOTE — ED Notes (Signed)
[facility] poison control contacted by Probation officer.  Per Patty at poison control as follows 24 cardiac monitor EKG q8 hrs Charcoal PO Monitor for seizure, QRS widening, and QTC elongation  If seizure occur treat with Benzo For QRS widening give 120 amps Bicarb bolus For QTC elongation over 500 replace Potassium to 4 and Mag to 2  Recheck tylenol between 0700-0800   EDP Dr. Tamala Julian notified of recommendations.

## 2022-06-23 NOTE — ED Notes (Signed)
Patient gave verbal consent to speak with mother Naiomi Musto [phone] and to give updates.

## 2022-06-23 NOTE — BH Assessment (Signed)
Comprehensive Clinical Assessment (CCA) Note  06/23/2022 Ensley Blas [id number]  Chief Complaint: Patient is a 21 year old female presenting to [facility] ED under IVC. Per triage note Patient arrived by [facility] from [place] for suicide attempt of over dose 15-150 mg Wellbutrin 5-20 mg Lexapro. Patient states she has been under a lot of stress and was out with friends at frat parties and became upset.  Patient states she had a couple of drinks at the parties. During assessment patient appears alert and oriented x4, calm and cooperative, somewhat guarded and resistant to give detailed information regarding how she's feeling. Patient reports "I got overwhelmed." When asked why the patient got overwhelmed she reports "I've got a lot going on." Patient reports issues with her family but would not go into detail. Patient reports that she was at a party tonight and also had some drinks tonight "I went home early with my roommate and I called my brother and he tried to talk me through it, he wasn't able to talk me through it so he called my mother and my mother wasn't able to either so they called 911." Patient reports that she took both her Lexapro and her Wellbutrin and reports that this was her first attempt. Patient is originally from [place] and has a therapist and a psychiatrist provider in the area that prescribes her mental health medication. Patient reports being diagnosed with "Depression, PTSD, Anxiety and Bipolar." Patient denies HI/AH/VH. Patient's BAL was 191 upon arrival.  Per Psyc NP Elenore Paddy patient is recommended for Inpatient Chief Complaint  Patient presents with   Suicide Attempt   Visit Diagnosis: Major Depressive Disorder, recurrent episode, severe. PTSD    CCA Screening, Triage and Referral (STR)  Patient Reported Information How did you hear about [place]? Legal System  Referral name: No data recorded Referral phone number: No data recorded  Whom do you see for routine  medical problems? No data recorded Practice/Facility Name: No data recorded Practice/Facility Phone Number: No data recorded Name of Contact: No data recorded Contact Number: No data recorded Contact Fax Number: No data recorded Prescriber Name: No data recorded Prescriber Address (if known): No data recorded  What Is the Reason for Your Visit/Call Today? Patient arrived by [facility] from [place] for suicide attempt of over dose 15-150 mg Wellbutrin 5-20 mg Lexapro. Patient states she has been under a lot of stress and was out with friends at frat parties and became upset.  Patient states she had a couple of drinks at the parties  How Long Has This Been Causing You Problems? > than 6 months  What Do You Feel Would Help You the Most Today? No data recorded  Have You Recently Been in Any Inpatient Treatment (Hospital/Detox/Crisis Center/28-Day Program)? No data recorded Name/Location of Program/Hospital:No data recorded How Long Were You There? No data recorded When Were You Discharged? No data recorded  Have You Ever Received Services From [facility] Before? No data recorded Who Do You See at [facility]? No data recorded  Have You Recently Had Any Thoughts About Hurting Yourself? Yes  Are You Planning to Commit Suicide/Harm Yourself At This time? No   Have you Recently Had Thoughts About Hurting Someone Karolee Ohs? No  Explanation: No data recorded  Have You Used Any Alcohol or Drugs in the Past 24 Hours? Yes  How Long Ago Did You Use Drugs or Alcohol? No data recorded What Did You Use and How Much? Alcohol, unknown amounts   Do You  Currently Have a Therapist/Psychiatrist? Yes  Name of Therapist/Psychiatrist: Patient has a therapist and a psychiatrist   Have You Been Recently Discharged From Any Office Practice or Programs? No  Explanation of Discharge From Practice/Program: No data recorded    CCA Screening Triage Referral Assessment Type of Contact: Face-to-Face  Is  this Initial or Reassessment? No data recorded Date Telepsych consult ordered in [facility]:  No data recorded Time Telepsych consult ordered in [facility]:  No data recorded  Patient Reported Information Reviewed? No data recorded Patient Left Without Being Seen? No data recorded Reason for Not Completing Assessment: No data recorded  Collateral Involvement: No data recorded  Does Patient Have a Court Appointed Legal Guardian? No data recorded Name and Contact of Legal Guardian: No data recorded If Minor and Not Living with Parent(s), Who has Custody? No data recorded Is CPS involved or ever been involved? Never  Is APS involved or ever been involved? Never   Patient Determined To Be At Risk for Harm To Self or Others Based on Review of Patient Reported Information or Presenting Complaint? Yes, for Self-Harm  Method: No data recorded Availability of Means: No data recorded Intent: No data recorded Notification Required: No data recorded Additional Information for Danger to Others Potential: No data recorded Additional Comments for Danger to Others Potential: No data recorded Are There Guns or Other Weapons in Your Home? No data recorded Types of Guns/Weapons: No data recorded Are These Weapons Safely Secured?                            No data recorded Who Could Verify You Are Able To Have These Secured: No data recorded Do You Have any Outstanding Charges, Pending Court Dates, Parole/Probation? No data recorded Contacted To Inform of Risk of Harm To Self or Others: No data recorded  Location of Assessment: [facility] ED   Does Patient Present under Involuntary Commitment? Yes  IVC Papers Initial File Date: 06/23/22   [place] of Residence: [place]   Patient Currently Receiving the Following Services: Medication Management; Individual Therapy   Determination of Need: Emergent (2 hours)   Options For Referral: No data recorded    CCA Biopsychosocial Intake/Chief Complaint:  No data  recorded Current Symptoms/Problems: No data recorded  Patient Reported Schizophrenia/Schizoaffective Diagnosis in Past: No   Strengths: Patient is able to communicate her needs and care for herself  Preferences: No data recorded Abilities: No data recorded  Type of Services Patient Feels are Needed: No data recorded  Initial Clinical Notes/Concerns: No data recorded  Mental Health Symptoms Depression:   Change in energy/activity; Difficulty Concentrating; Hopelessness; Fatigue   Duration of Depressive symptoms:  Greater than two weeks   Mania:   None   Anxiety:    Difficulty concentrating; Restlessness   Psychosis:   None   Duration of Psychotic symptoms: No data recorded  Trauma:   Avoids reminders of event   Obsessions:   None   Compulsions:   None   Inattention:   None   Hyperactivity/Impulsivity:   None   Oppositional/Defiant Behaviors:   None   Emotional Irregularity:   None   Other Mood/Personality Symptoms:  No data recorded   Mental Status Exam Appearance and self-care  Stature:   Average   Weight:   Average weight   Clothing:   Casual   Grooming:   Normal   Cosmetic use:   None   Posture/gait:   Normal  Motor activity:   Not Remarkable   Sensorium  Attention:   Normal   Concentration:   Normal   Orientation:   X5   Recall/memory:   Normal   Affect and Mood  Affect:   Appropriate   Mood:   Depressed   Relating  Eye contact:   Normal   Facial expression:   Responsive   Attitude toward examiner:   Cooperative   Thought and Language  Speech flow:  Clear and Coherent   Thought content:   Appropriate to Mood and Circumstances   Preoccupation:   None   Hallucinations:   None   Organization:  No data recorded  [employer] of Knowledge:   Fair   Intelligence:   Average   Abstraction:   Normal   Judgement:   Poor   Reality Testing:   Adequate   Insight:   Lacking    Decision Making:   Impulsive   Social Functioning  Social Maturity:   Impulsive   Social Judgement:   Heedless   Stress  Stressors:   Family conflict   Coping Ability:   Contractor Deficits:   None   Supports:   Family; Friends/Service system     Religion: Religion/Spirituality Are You A Religious Person?: No  Leisure/Recreation: Leisure / Recreation Do You Have Hobbies?: No  Exercise/Diet: Exercise/Diet Do You Exercise?: No Have You Gained or Lost A Significant Amount of Weight in the Past Six Months?: No Do You Follow a Special Diet?: No Do You Have Any Trouble Sleeping?: Yes Explanation of Sleeping Difficulties: Patient reports that she sleeps too much   CCA Employment/Education Employment/Work Situation: Employment / Work Situation Employment Situation: Surveyor, minerals Job has Been Impacted by Current Illness: No Has Patient ever Been in the [employer]?: No  Education: Education Is Patient Currently Attending School?: Yes School Currently Attending: [employer] Did You Attend College?: Yes What Type of College Degree Do you Have?: Patient is currently in college Did You Have An Individualized Education Program (IIEP): No Did You Have Any Difficulty At School?: No Patient's Education Has Been Impacted by Current Illness: No   CCA Family/Childhood History Family and Relationship History: Family history Marital status: Single Does patient have children?: No  Childhood History:  Childhood History By whom was/is the patient raised?: Both parents Did patient suffer any verbal/emotional/physical/sexual abuse as a child?: No Did patient suffer from severe childhood neglect?: No Has patient ever been sexually abused/assaulted/raped as an adolescent or adult?: No Was the patient ever a victim of a crime or a disaster?: No Witnessed domestic violence?: No Has patient been affected by domestic violence as an adult?: No  Child/Adolescent  Assessment:     CCA Substance Use Alcohol/Drug Use: Alcohol / Drug Use Pain Medications: See MAR Prescriptions: See MAR Over the Counter: See MAR History of alcohol / drug use?: Yes Substance #1 Name of Substance 1: Alcohol 1 - Amount (size/oz): Unknown amounts 1 - Last Use / Amount: 06/23/22                       ASAM's:  Six Dimensions of Multidimensional Assessment  Dimension 1:  Acute Intoxication and/or Withdrawal Potential:      Dimension 2:  Biomedical Conditions and Complications:      Dimension 3:  Emotional, Behavioral, or Cognitive Conditions and Complications:     Dimension 4:  Readiness to Change:     Dimension 5:  Relapse, Continued  use, or Continued Problem Potential:     Dimension 6:  Recovery/Living Environment:     ASAM Severity Score:    ASAM Recommended Level of Treatment:     Substance use Disorder (SUD)    Recommendations for Services/Supports/Treatments:    DSM5 Diagnoses: Patient Active Problem List   Diagnosis Date Noted   PTSD (post-traumatic stress disorder) 06/23/2022   Attention deficit hyperactivity disorder (ADHD) 06/23/2022   GAD (generalized anxiety disorder) 06/23/2022   MDD (major depressive disorder), recurrent episode, severe ([place]) 06/23/2022    Patient Centered Plan: Patient is on the following Treatment Plan(s):  Anxiety, Depression, and Post Traumatic Stress Disorder   Referrals to Alternative Service(s): Referred to Alternative Service(s):   Place:   Date:   Time:    Referred to Alternative Service(s):   Place:   Date:   Time:    Referred to Alternative Service(s):   Place:   Date:   Time:    Referred to Alternative Service(s):   Place:   Date:   Time:      @[facility] @  [employer], LCAS-A

## 2022-06-23 NOTE — ED Notes (Signed)
Pt dressed by this tech and RN Maudie Mercury. Pt belongings include:   3 rings 3 necklace 8 earrings 1 belly button ring 7 bracelets 1 anklet Set of keys Phone Shirt Shorts Sandals underwear

## 2022-06-23 NOTE — ED Notes (Signed)
IVC/pending inpatient psych admission when medically cleared 

## 2022-06-23 NOTE — ED Notes (Signed)
poison control they recommended she be monitored until 3am at least and must have normal ekg before discharge. They said to watch for seizure and Qt prolongation. They recommended no antiemetics or antipsychotics since they would prolong QTC. They recommended benzos for any agitation or seizures. Phenobarbital second line for seizures. They recommended to check Mg level if patient begins to have seizures or if Qtc goes above 480   MD Niu notified

## 2022-06-23 NOTE — ED Notes (Signed)
Psych provider and TTS in with patient.

## 2022-06-23 NOTE — Consult Note (Addendum)
[facility] Face-to-Face Psychiatry Consult   Reason for Consult: Intentional Drug Overdose  Referring Physician: Dr. Katrinka Blazing Patient Identification: Denise Jordan MRN:  [id number] Principal Diagnosis: <principal problem not specified> Diagnosis:  Active Problems:   PTSD (post-traumatic stress disorder)   Attention deficit hyperactivity disorder (ADHD)   GAD (generalized anxiety disorder)   MDD (major depressive disorder), recurrent episode, severe (HCC)   Bipolar I disorder (HCC)   Total Time spent with patient: 1 hour  Subjective: "I became overwhelmed tonight." Denise Jordan is a 21 y.o. female patient presented to [facility] ED via EMS from [facility] for suicide attempt due to overdosing on 15-150 mg Wellbutrin 5-20 mg Lexapro. The patient shared that she has been under a lot of stress, and tonight, she impulsively reacted to all of the emotions she was experiencing and intentionally overdosed on her medications. The patient shared that she was diagnosed with PTSD when she was 24 years old her mom had a stroke. She stated, "My mom was considered a vegetable, but she recovered." She also shared that her dad passed away when she was 63. The patient states, "I am from [place] and do not have any family in [place]." She shared that she has a brother in [place]. She said she spoke to him and her mom tonight but still attempted suicide after talking to them. The patient does admit to having mental health providers(therapist) in [place]. This provider saw The patient face-to-face; the chart was reviewed, and consulted with Dr. Katrinka Blazing on 06/23/2022 due to the patient's care. It was discussed with the EDP that the patient does meet the criteria to be admitted to the psychiatric inpatient unit.  On evaluation, the patient is alert and oriented x 4, anxious at times, uncooperative, and mood-congruent with affect. The patient does not appear to be responding to internal or external stimuli. Neither is the patient presenting with any  delusional thinking. The patient denies auditory or visual hallucinations. The patient denies any suicidal, homicidal, or self-harm ideations. The patient is not presenting with any psychotic or paranoid behaviors. During an encounter with the patient, she could answer questions appropriately. The patient UDS has not resulted and BAL 191 mg/dl.   HPI:    Past Psychiatric History: History reviewed. No pertinent past psychiatric history  Risk to Self:   Risk to Others:   Prior Inpatient Therapy:   Prior Outpatient Therapy:    Past Medical History: History reviewed. No pertinent past medical history. History reviewed. No pertinent surgical history. Family History: History reviewed. No pertinent family history. Family Psychiatric  History: History reviewed. No pertinent family psychiatric history Social History:  Social History   Substance and Sexual Activity  Alcohol Use Yes     Social History   Substance and Sexual Activity  Drug Use Never    Social History   Socioeconomic History   Marital status: Single    Spouse name: Not on file   Number of children: Not on file   Years of education: Not on file   Highest education level: Not on file  Occupational History   Not on file  Tobacco Use   Smoking status: Never   Smokeless tobacco: Never  Vaping Use   Vaping Use: Never used  Substance and Sexual Activity   Alcohol use: Yes   Drug use: Never   Sexual activity: Not on file  Other Topics Concern   Not on file  Social History Narrative   Not on file   Social Determinants of Health  Financial Resource Strain: Not on file  Food Insecurity: Not on file  Transportation Needs: Not on file  Physical Activity: Not on file  Stress: Not on file  Social Connections: Not on file   Additional Social History:    Allergies:  No Known Allergies  Labs:  Results for orders placed or performed during the hospital encounter of 06/23/22 (from the past 48 hour(s))  Urine Drug  Screen, Qualitative     Status: None   Collection Time: 06/23/22  3:50 AM  Result Value Ref Range   Tricyclic, Ur Screen NONE DETECTED NONE DETECTED   Amphetamines, Ur Screen NONE DETECTED NONE DETECTED   MDMA (Ecstasy)Ur Screen NONE DETECTED NONE DETECTED   Cocaine Metabolite,Ur [place] NONE DETECTED NONE DETECTED   Opiate, Ur Screen NONE DETECTED NONE DETECTED   Phencyclidine (PCP) Ur S NONE DETECTED NONE DETECTED   Cannabinoid 50 Ng, Ur [place] NONE DETECTED NONE DETECTED   Barbiturates, Ur Screen NONE DETECTED NONE DETECTED   Benzodiazepine, Ur Scrn NONE DETECTED NONE DETECTED   Methadone Scn, Ur NONE DETECTED NONE DETECTED    Comment: (NOTE) Tricyclics + metabolites, urine    Cutoff 1000 ng/mL Amphetamines + metabolites, urine  Cutoff 1000 ng/mL MDMA (Ecstasy), urine              Cutoff 500 ng/mL Cocaine Metabolite, urine          Cutoff 300 ng/mL Opiate + metabolites, urine        Cutoff 300 ng/mL Phencyclidine (PCP), urine         Cutoff 25 ng/mL Cannabinoid, urine                 Cutoff 50 ng/mL Barbiturates + metabolites, urine  Cutoff 200 ng/mL Benzodiazepine, urine              Cutoff 200 ng/mL Methadone, urine                   Cutoff 300 ng/mL  The urine drug screen provides only a preliminary, unconfirmed analytical test result and should not be used for non-medical purposes. Clinical consideration and professional judgment should be applied to any positive drug screen result due to possible interfering substances. A more specific alternate chemical method must be used in order to obtain a confirmed analytical result. Gas chromatography / mass spectrometry (GC/MS) is the preferred confirm atory method. Performed at [facility], [address] [place], [place] [address]   Urinalysis, Routine w reflex microscopic     Status: Abnormal   Collection Time: 06/23/22  3:50 AM  Result Value Ref Range   Color, Urine COLORLESS (A) YELLOW   APPearance CLEAR (A) CLEAR    Specific Gravity, Urine 1.001 (L) 1.005 - 1.030   pH 6.0 5.0 - 8.0   Glucose, UA NEGATIVE NEGATIVE mg/dL   Hgb urine dipstick NEGATIVE NEGATIVE   Bilirubin Urine NEGATIVE NEGATIVE   Ketones, ur NEGATIVE NEGATIVE mg/dL   Protein, ur NEGATIVE NEGATIVE mg/dL   Nitrite NEGATIVE NEGATIVE   Leukocytes,Ua NEGATIVE NEGATIVE    Comment: Performed at [facility], [address] [place], [place] [address]  POC urine preg, ED     Status: None   Collection Time: 06/23/22  3:55 AM  Result Value Ref Range   Preg Test, Ur NEGATIVE NEGATIVE    Comment:        THE SENSITIVITY OF THIS METHODOLOGY IS >24 mIU/mL   Comprehensive metabolic panel     Status: Abnormal  Collection Time: 06/23/22  4:17 AM  Result Value Ref Range   Sodium 139 135 - 145 mmol/L   Potassium 3.4 (L) 3.5 - 5.1 mmol/L   Chloride 107 98 - 111 mmol/L   CO2 22 22 - 32 mmol/L   Glucose, Bld 94 70 - 99 mg/dL    Comment: Glucose reference range applies only to samples taken after fasting for at least 8 hours.   BUN 13 6 - 20 mg/dL   Creatinine, Ser 0.89 0.44 - 1.00 mg/dL   Calcium 9.2 8.9 - 10.3 mg/dL   Total Protein 7.8 6.5 - 8.1 g/dL   Albumin 4.4 3.5 - 5.0 g/dL   AST 26 15 - 41 U/L   ALT 14 0 - 44 U/L   Alkaline Phosphatase 40 38 - 126 U/L   Total Bilirubin 0.4 0.3 - 1.2 mg/dL   GFR, Estimated >60 >60 mL/min    Comment: (NOTE) Calculated using the CKD-EPI Creatinine Equation (2021)    Anion gap 10 5 - 15    Comment: Performed at [facility], [address] [place], [place] [address]  Ethanol     Status: Abnormal   Collection Time: 06/23/22  4:17 AM  Result Value Ref Range   Alcohol, Ethyl (B) 191 (H) <10 mg/dL    Comment: (NOTE) Lowest detectable limit for serum alcohol is 10 mg/dL.  For medical purposes only. Performed at [facility], [address] [place], [place] [address]   CBC with Diff     Status: Abnormal   Collection Time: 06/23/22  4:17 AM  Result Value Ref Range    WBC 8.0 4.0 - 10.5 K/uL   RBC 3.98 3.87 - 5.11 MIL/uL   Hemoglobin 13.6 12.0 - 15.0 g/dL   HCT 40.3 36.0 - 46.0 %   MCV 101.3 (H) 80.0 - 100.0 fL   MCH 34.2 (H) 26.0 - 34.0 pg   MCHC 33.7 30.0 - 36.0 g/dL   RDW 11.6 11.5 - 15.5 %   Platelets 263 150 - 400 K/uL   nRBC 0.0 0.0 - 0.2 %   Neutrophils Relative % 63 %   Neutro Abs 5.1 1.7 - 7.7 K/uL   Lymphocytes Relative 27 %   Lymphs Abs 2.2 0.7 - 4.0 K/uL   Monocytes Relative 7 %   Monocytes Absolute 0.6 0.1 - 1.0 K/uL   Eosinophils Relative 1 %   Eosinophils Absolute 0.1 0.0 - 0.5 K/uL   Basophils Relative 1 %   Basophils Absolute 0.1 0.0 - 0.1 K/uL   Immature Granulocytes 1 %   Abs Immature Granulocytes 0.04 0.00 - 0.07 K/uL    Comment: Performed at [facility], [address] [place], [place] [address]  Salicylate level     Status: Abnormal   Collection Time: 06/23/22  4:17 AM  Result Value Ref Range   Salicylate Lvl <8.0 (L) 7.0 - 30.0 mg/dL    Comment: Performed at [facility], [address] [place], [place] [address]  Acetaminophen level     Status: Abnormal   Collection Time: 06/23/22  4:17 AM  Result Value Ref Range   Acetaminophen (Tylenol), Serum <10 (L) 10 - 30 ug/mL    Comment: (NOTE) Therapeutic concentrations vary significantly. A range of 10-30 ug/mL  may be an effective concentration for many patients. However, some  are best treated at concentrations outside of this range. Acetaminophen concentrations >150 ug/mL at 4 hours after ingestion  and >50 ug/mL at 12 hours  after ingestion are often associated with  toxic reactions.  Performed at [facility], [address] [place], [place] [address]   Magnesium     Status: None   Collection Time: 06/23/22  4:17 AM  Result Value Ref Range   Magnesium 2.2 1.7 - 2.4 mg/dL    Comment: Performed at [facility], [address] [place], [place] [address]    No current facility-administered medications for this  encounter.   Current Outpatient Medications  Medication Sig Dispense Refill   elvitegravir-cobicistat-emtricitabine-tenofovir (GENVOYA) 150-150-200-10 MG TABS tablet Take 1 tablet by mouth daily with breakfast. 30 tablet 0    Musculoskeletal: Strength & Muscle Tone: within normal limits Gait & Station: normal Patient leans: N/A  Psychiatric Specialty Exam:  Presentation  General Appearance:  Appropriate for Environment  Eye Contact: Good  Speech: Clear and Coherent  Speech Volume: Normal  Handedness: Right   Mood and Affect  Mood: Anxious; Depressed; Hopeless  Affect: Congruent   Thought Process  Thought Processes: Coherent  Descriptions of Associations:Intact  Orientation:Full (Time, Place and Person)  Thought Content:Logical  History of Schizophrenia/Schizoaffective disorder:No  Duration of Psychotic Symptoms:No data recorded Hallucinations:Hallucinations: None  Ideas of Reference:None  Suicidal Thoughts:Suicidal Thoughts: No  Homicidal Thoughts:Homicidal Thoughts: No   Sensorium  Memory: Immediate Good; Recent Good; Remote Good  Judgment: Poor  Insight: Poor   Executive Functions  Concentration: Fair  Attention Span: Fair  Recall: Good  Fund of Knowledge: Good  Language: Good   Psychomotor Activity  Psychomotor Activity: Psychomotor Activity: Normal   Assets  Assets: Communication Skills; Desire for Improvement; Resilience; Social Support   Sleep  Sleep: Sleep: Good Number of Hours of Sleep: 8   Physical Exam: Physical Exam Vitals and nursing note reviewed.  Constitutional:      Appearance: Normal appearance. She is normal weight.  HENT:     Head: Normocephalic and atraumatic.     Right Ear: External ear normal.     Left Ear: External ear normal.     Nose: Nose normal.     Mouth/Throat:     Mouth: Mucous membranes are dry.  Cardiovascular:     Rate and Rhythm: Normal rate.     Pulses: Normal  pulses.  Pulmonary:     Effort: Pulmonary effort is normal.  Musculoskeletal:        General: Normal range of motion.     Cervical back: Normal range of motion and neck supple.  Neurological:     Mental Status: She is alert.  Psychiatric:        Attention and Perception: Attention and perception normal.        Mood and Affect: Mood is anxious and depressed. Affect is blunt, flat, tearful and inappropriate.        Speech: Speech normal.        Behavior: Behavior is uncooperative, agitated and withdrawn.        Thought Content: Thought content normal.        Cognition and Memory: Cognition and memory normal.        Judgment: Judgment is impulsive and inappropriate.    Review of Systems  Psychiatric/Behavioral:  Positive for depression, substance abuse and suicidal ideas. The patient is nervous/anxious.   All other systems reviewed and are negative.  Blood pressure (!) 108/59, pulse (!) 103, resp. rate 17, height 5\' 5"  (1.651 m), weight 81.6 kg, SpO2 99 %. Body mass index is 29.95 kg/m.  Treatment Plan Summary: Plan Patient does  meet criteria for psychiatric inpatient admission  Disposition: Recommend psychiatric Inpatient admission when medically cleared. Supportive therapy provided about ongoing stressors.  Gillermo Murdoch, NP 06/23/2022 5:22 AM

## 2022-06-23 NOTE — ED Notes (Signed)
Patient became upset and wanting to leave. Writer and security stopped patient and advised patient she could not leave at this time. Patient became tearful but laid in bed. Writer talked patient into doing deep breathing to try and calm herself due to patient hyperventilating. Patient was able to slow her breathing and allowed writer to place IV and obtain blood work.

## 2022-06-23 NOTE — ED Notes (Signed)
Patient's mother Shawntavia Saunders updated on patient and phone times and visitor hours. Mother states she will be here Saturday morning.

## 2022-06-24 ENCOUNTER — Inpatient Hospital Stay (HOSPITAL_COMMUNITY): Admission: RE | Admit: 2022-06-24 | Payer: Self-pay | Source: Intra-hospital | Admitting: Psychiatry

## 2022-06-24 DIAGNOSIS — F332 Major depressive disorder, recurrent severe without psychotic features: Secondary | ICD-10-CM | POA: Diagnosis not present

## 2022-06-24 DIAGNOSIS — F3132 Bipolar disorder, current episode depressed, moderate: Secondary | ICD-10-CM

## 2022-06-24 DIAGNOSIS — F314 Bipolar disorder, current episode depressed, severe, without psychotic features: Secondary | ICD-10-CM | POA: Diagnosis present

## 2022-06-24 DIAGNOSIS — T50902D Poisoning by unspecified drugs, medicaments and biological substances, intentional self-harm, subsequent encounter: Secondary | ICD-10-CM

## 2022-06-24 LAB — BASIC METABOLIC PANEL
Anion gap: 5 (ref 5–15)
BUN: 14 mg/dL (ref 6–20)
CO2: 23 mmol/L (ref 22–32)
Calcium: 8.6 mg/dL — ABNORMAL LOW (ref 8.9–10.3)
Chloride: 110 mmol/L (ref 98–111)
Creatinine, Ser: 0.9 mg/dL (ref 0.44–1.00)
GFR, Estimated: >60 mL/min (ref >60)
Glucose, Bld: 93 mg/dL (ref 70–99)
Potassium: 3.6 mmol/L (ref 3.5–5.1)
Sodium: 138 mmol/L (ref 135–145)

## 2022-06-24 NOTE — Progress Notes (Signed)
Patient requests to have someone from medical team call her mother Freda Munro, who she states will be arriving here around 3pm. Attending and psych services notified.

## 2022-06-24 NOTE — Progress Notes (Signed)
Spoke with Patty at [employer], update on patient given. Chong Sicilian states they are "closing the patient out, if any questions arise, please contact Poison Control."

## 2022-06-24 NOTE — TOC Transition Note (Signed)
Transition of Care [facility]) - CM/SW Discharge Note   Patient Details  Name: Denise Jordan MRN: [id number] Date of Birth: 05/30/2001  [facility] CM/SW Contact:  Izola Price, RN Phone Number: 06/24/2022, 1:10 PM   Clinical Narrative: 10/21:  Patient has been accepted to [facility].  Accepted by Nurse Practitioner Waylan Boga, on the behalf of Dr.Joshi per [facility] consult note. [facility] provider DC Summary in chart. TOC consult for SA resources placed in AVS summary. Simmie Davies RN CM           Patient Goals and CMS Choice        Discharge Placement                       Discharge Plan and Services                                     Social Determinants of Health (SDOH) Interventions     Readmission Risk Interventions     No data to display

## 2022-06-24 NOTE — Plan of Care (Signed)

## 2022-06-24 NOTE — Progress Notes (Signed)
Mother at bedside to meet with patient. Mother requesting to have safety sitter removed to discuss things "privately" with her daughter. Explained to mother that was not possible due to IVC order. Mother immediately became irate and wanted to know why it was put in place since she "didn't resist arrest." Explained IVC process and reasons for implementation. Mother remains irate. Psych NP notified and will meet with patient and mother at bedside.

## 2022-06-24 NOTE — Progress Notes (Signed)
Waylan Boga, NP and [facility] counselor left beside after speaking with patient and patient's mother. IVC to be rescinded. Safety sitter released from room by NP.

## 2022-06-24 NOTE — BH Assessment (Signed)
Patient has been accepted to [facility].  Accepted by Nurse Practitioner Waylan Boga, on the behalf of Dr.  Daneil Dolin . Attending  Physician will be Dr.  Daneil Dolin .  Patient has been assigned to room 325, by [place] Nurse Demetria   Call report to [phone].  Representative/Transfer Coordinator is Print production planner Patient pre-admitted by [employer] [facility] Patient Access Apolonio Schneiders)  Patient's nurse (Tiffany, RN) has been made aware of acceptance.

## 2022-06-24 NOTE — Progress Notes (Signed)
Patient's mother to meet with care team upon her arrival. Will hold off on transfer to [facility] until after meeting per request of psych NP

## 2022-06-24 NOTE — Progress Notes (Signed)
Decision made to d/c home instead of voluntarily going to [facility]. Inpatient team made aware.

## 2022-06-24 NOTE — Discharge Summary (Signed)
Physician Discharge Summary  Denise Jordan ZOX:[id number] DOB: 2000-11-02 DOA: 06/23/2022  PCP: Pcp, No  Admit date: 06/23/2022 Discharge date: 06/24/2022  Admitted From: Home Disposition:  Inpatient BHU  Recommendations for Outpatient Follow-up:  Follow up with PCP in 1-2 weeks   Home Health:No Equipment/Devices:None   Discharge Condition:Stable  CODE STATUS:FULL  Diet recommendation: Reg  Brief/Interim Summary: 21 y.o. female with medical history significant of ADHD, bipolar I disorder ([place]), GAD (generalized anxiety disorder, MDD (major depressive disorder), PTSD (post-traumatic stress disorder), alcohol use, who presents with overdose.   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  She has tremendous psychosocial stress recently. She reports taking 10-15 tablets of 150 mg Wellbutrin as well as 5 tablets of 20 mg Lexapro.  She denies suicidal homicidal ideations to me.  Patient does not have chest pain, cough, shortness breath.  No nausea vomiting, diarrhea or abdominal pain.  No symptoms of UTI.  She states that she drinks vodka, 3 times per week, 1 to 2 glasses of vodka each time.  Patient was maintained on the medicine service for 1 midnight.  Maintain on telemetry.  Sinus rhythm with normal QTc.  Twelve-lead EKG on the day of discharge to [facility] reveals the same.  Patient appropriate for DC to inpatient behavioral health unit at this time.  No changes made no medication regimen.    Discharge Diagnoses:  Active Problems:   Bipolar affective disorder, depressed, severe (HCC)   Bipolar affective disorder, depressed, severe (HCC)   Attention deficit hyperactivity disorder (ADHD)   GAD (generalized anxiety disorder)   PTSD (post-traumatic stress disorder)   Alcohol use   Hypokalemia  Intentional overdose Patient took an excessive amount of Wellbutrin and Seroquel.  Seen by inpatient psychiatry.  Deemed appropriate for discharge to inpatient BHU.  Was monitored on  medicine service overnight.  Normal EKG reviewed and confirmed for discharge.  Discharge Instructions  Discharge Instructions     Diet - low sodium heart healthy   Complete by: As directed    Increase activity slowly   Complete by: As directed       Allergies as of 06/24/2022   No Known Allergies      Medication List     TAKE these medications    amphetamine-dextroamphetamine 30 MG 24 hr capsule Commonly known as: ADDERALL XR Take 30 mg by mouth every morning.   ARIPiprazole 5 MG tablet Commonly known as: ABILIFY Take 5 mg by mouth daily.   buPROPion 150 MG 24 hr tablet Commonly known as: WELLBUTRIN XL Take 150 mg by mouth daily.   escitalopram 20 MG tablet Commonly known as: LEXAPRO Take 20 mg by mouth daily.        No Known Allergies  Consultations: Psychiatry   Procedures/Studies: No results found.    Subjective: Seen and examined on day of discharge.  Stable no distress.  Twelve-lead EKG reviewed.  Stable for DC to inpatient BHU.  Discharge Exam: Vitals:   06/24/22 0503 06/24/22 1353  BP: (!) 106/56 111/61  Pulse: 72 78  Resp: 18 18  Temp: 98 F (36.7 C) 98.4 F (36.9 C)  SpO2: 99% 99%   Vitals:   06/23/22 1534 06/23/22 2055 06/24/22 0503 06/24/22 1353  BP: 112/64 105/60 (!) 106/56 111/61  Pulse: 92 79 72 78  Resp: 16 18 18 18   Temp: 98.5 F (36.9 C) 98.1 F (36.7 C) 98 F (36.7 C) 98.4 F (36.9 C)  TempSrc: Oral Oral Oral   SpO2:  99% 99% 99% 99%  Weight:      Height:        General: Pt is alert, awake, not in acute distress Cardiovascular: RRR, S1/S2 +, no rubs, no gallops Respiratory: CTA bilaterally, no wheezing, no rhonchi Abdominal: Soft, NT, ND, bowel sounds + Extremities: no edema, no cyanosis    The results of significant diagnostics from this hospitalization (including imaging, microbiology, ancillary and laboratory) are listed below for reference.     Microbiology: Recent Results (from the past 240 hour(s))   Resp Panel by RT-PCR (Flu A&B, Covid) Anterior Nasal Swab     Status: None   Collection Time: 06/23/22  4:17 AM   Specimen: Anterior Nasal Swab  Result Value Ref Range Status   SARS Coronavirus 2 by RT PCR NEGATIVE NEGATIVE Final    Comment: (NOTE) SARS-CoV-2 target nucleic acids are NOT DETECTED.  The SARS-CoV-2 RNA is generally detectable in upper respiratory specimens during the acute phase of infection. The lowest concentration of SARS-CoV-2 viral copies this assay can detect is 138 copies/mL. A negative result does not preclude SARS-Cov-2 infection and should not be used as the sole basis for treatment or other patient management decisions. A negative result may occur with  improper specimen collection/handling, submission of specimen other than nasopharyngeal swab, presence of viral mutation(s) within the areas targeted by this assay, and inadequate number of viral copies(<138 copies/mL). A negative result must be combined with clinical observations, patient history, and epidemiological information. The expected result is Negative.  Fact Sheet for Patients:  BloggerCourse.com  Fact Sheet for Healthcare Providers:  SeriousBroker.it  This test is no t yet approved or cleared by the [place] FDA and  has been authorized for detection and/or diagnosis of SARS-CoV-2 by FDA under an Emergency Use Authorization (EUA). This EUA will remain  in effect (meaning this test can be used) for the duration of the COVID-19 declaration under Section 564(b)(1) of the Act, 21 U.S.C.section 360bbb-3(b)(1), unless the authorization is terminated  or revoked sooner.       Influenza A by PCR NEGATIVE NEGATIVE Final   Influenza B by PCR NEGATIVE NEGATIVE Final    Comment: (NOTE) The Xpert Xpress SARS-CoV-2/FLU/RSV plus assay is intended as an aid in the diagnosis of influenza from Nasopharyngeal swab specimens and should not be used as a  sole basis for treatment. Nasal washings and aspirates are unacceptable for Xpert Xpress SARS-CoV-2/FLU/RSV testing.  Fact Sheet for Patients: BloggerCourse.com  Fact Sheet for Healthcare Providers: SeriousBroker.it  This test is not yet approved or cleared by the [place] FDA and has been authorized for detection and/or diagnosis of SARS-CoV-2 by FDA under an Emergency Use Authorization (EUA). This EUA will remain in effect (meaning this test can be used) for the duration of the COVID-19 declaration under Section 564(b)(1) of the Act, 21 U.S.C. section 360bbb-3(b)(1), unless the authorization is terminated or revoked.  Performed at [facility], [address] [place], [place] [address]      Labs: BNP (last 3 results) No results for input(s): "BNP" in the last 8760 hours. Basic Metabolic Panel: Recent Labs  Lab 06/23/22 0417 06/24/22 0459  NA 139 138  K 3.4* 3.6  CL 107 110  CO2 22 23  GLUCOSE 94 93  BUN 13 14  CREATININE 0.89 0.90  CALCIUM 9.2 8.6*  MG 2.2  --     Liver Function Tests: Recent Labs  Lab 06/23/22 0417  AST 26  ALT 14  ALKPHOS 40  BILITOT 0.4  PROT 7.8  ALBUMIN 4.4    No results for input(s): "LIPASE", "AMYLASE" in the last 168 hours. No results for input(s): "AMMONIA" in the last 168 hours. CBC: Recent Labs  Lab 06/23/22 0417  WBC 8.0  NEUTROABS 5.1  HGB 13.6  HCT 40.3  MCV 101.3*  PLT 263    Cardiac Enzymes: No results for input(s): "CKTOTAL", "CKMB", "CKMBINDEX", "TROPONINI" in the last 168 hours. BNP: Invalid input(s): "POCBNP" CBG: No results for input(s): "GLUCAP" in the last 168 hours. D-Dimer No results for input(s): "DDIMER" in the last 72 hours. Hgb A1c No results for input(s): "HGBA1C" in the last 72 hours. Lipid Profile No results for input(s): "CHOL", "HDL", "LDLCALC", "TRIG", "CHOLHDL", "LDLDIRECT" in the last 72 hours. Thyroid function  studies No results for input(s): "TSH", "T4TOTAL", "T3FREE", "THYROIDAB" in the last 72 hours.  Invalid input(s): "FREET3" Anemia work up No results for input(s): "VITAMINB12", "FOLATE", "FERRITIN", "TIBC", "IRON", "RETICCTPCT" in the last 72 hours. Urinalysis    Component Value Date/Time   COLORURINE COLORLESS (A) 06/23/2022 0350   APPEARANCEUR CLEAR (A) 06/23/2022 0350   LABSPEC 1.001 (L) 06/23/2022 0350   PHURINE 6.0 06/23/2022 0350   GLUCOSEU NEGATIVE 06/23/2022 0350   HGBUR NEGATIVE 06/23/2022 0350   BILIRUBINUR NEGATIVE 06/23/2022 0350   KETONESUR NEGATIVE 06/23/2022 0350   PROTEINUR NEGATIVE 06/23/2022 0350   NITRITE NEGATIVE 06/23/2022 0350   LEUKOCYTESUR NEGATIVE 06/23/2022 0350   Sepsis Labs Recent Labs  Lab 06/23/22 0417  WBC 8.0    Microbiology Recent Results (from the past 240 hour(s))  Resp Panel by RT-PCR (Flu A&B, Covid) Anterior Nasal Swab     Status: None   Collection Time: 06/23/22  4:17 AM   Specimen: Anterior Nasal Swab  Result Value Ref Range Status   SARS Coronavirus 2 by RT PCR NEGATIVE NEGATIVE Final    Comment: (NOTE) SARS-CoV-2 target nucleic acids are NOT DETECTED.  The SARS-CoV-2 RNA is generally detectable in upper respiratory specimens during the acute phase of infection. The lowest concentration of SARS-CoV-2 viral copies this assay can detect is 138 copies/mL. A negative result does not preclude SARS-Cov-2 infection and should not be used as the sole basis for treatment or other patient management decisions. A negative result may occur with  improper specimen collection/handling, submission of specimen other than nasopharyngeal swab, presence of viral mutation(s) within the areas targeted by this assay, and inadequate number of viral copies(<138 copies/mL). A negative result must be combined with clinical observations, patient history, and epidemiological information. The expected result is Negative.  Fact Sheet for Patients:   BloggerCourse.com  Fact Sheet for Healthcare Providers:  SeriousBroker.it  This test is no t yet approved or cleared by the [place] FDA and  has been authorized for detection and/or diagnosis of SARS-CoV-2 by FDA under an Emergency Use Authorization (EUA). This EUA will remain  in effect (meaning this test can be used) for the duration of the COVID-19 declaration under Section 564(b)(1) of the Act, 21 U.S.C.section 360bbb-3(b)(1), unless the authorization is terminated  or revoked sooner.       Influenza A by PCR NEGATIVE NEGATIVE Final   Influenza B by PCR NEGATIVE NEGATIVE Final    Comment: (NOTE) The Xpert Xpress SARS-CoV-2/FLU/RSV plus assay is intended as an aid in the diagnosis of influenza from Nasopharyngeal swab specimens and should not be used as a sole basis for treatment. Nasal washings and aspirates are unacceptable for Xpert Xpress SARS-CoV-2/FLU/RSV testing.  Fact Sheet for Patients:  BloggerCourse.com  Fact Sheet for Healthcare Providers: SeriousBroker.it  This test is not yet approved or cleared by the [place] FDA and has been authorized for detection and/or diagnosis of SARS-CoV-2 by FDA under an Emergency Use Authorization (EUA). This EUA will remain in effect (meaning this test can be used) for the duration of the COVID-19 declaration under Section 564(b)(1) of the Act, 21 U.S.C. section 360bbb-3(b)(1), unless the authorization is terminated or revoked.  Performed at [facility], [address], [place], [place] [address]      Time coordinating discharge: Over 30 minutes  SIGNED:   Tresa Moore, MD  [facility] 06/24/2022, 4:56 PM Pager   If 7PM-7AM, please contact night-coverage

## 2022-06-24 NOTE — Progress Notes (Signed)
Patient with multiple visitors at bedside.

## 2022-06-24 NOTE — Progress Notes (Signed)
Brief progress note.  Full note to follow.  Patient is an 21 year old female admitted to medicine service for overnight monitoring after intentional overdose on Wellbutrin.  EKG repeated this a.m. and reviewed by me.  Normal sinus rhythm, QTc reassuring.  Patient is medically stable for discharge at this time.  Will dispo to inpatient psychiatry.  Psychiatry NP Waylan Boga notified of medical readiness.  Discharge pending bed availability.  Ralene Muskrat MD  No charge

## 2022-06-24 NOTE — Progress Notes (Deleted)
Physician Discharge Summary  Denise Jordan ONG:[id number] DOB: [birth year] DOA: 06/23/2022  PCP: Pcp, No  Admit date: 06/23/2022 Discharge date: 06/24/2022  Admitted From: Home Disposition:  Inpatient BHU  Recommendations for Outpatient Follow-up:  Follow up with PCP in 1-2 weeks   Home Health:No Equipment/Devices:None   Discharge Condition:Stable  CODE STATUS:FULL  Diet recommendation: Reg  Brief/Interim Summary: 21 y.o. female with medical history significant of ADHD, bipolar I disorder (HCC), GAD (generalized anxiety disorder, MDD (major depressive disorder), PTSD (post-traumatic stress disorder), alcohol use, who presents with overdose.   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  She has tremendous psychosocial stress recently. She reports taking 10-15 tablets of 150 mg Wellbutrin as well as 5 tablets of 20 mg Lexapro.  She denies suicidal homicidal ideations to me.  Patient does not have chest pain, cough, shortness breath.  No nausea vomiting, diarrhea or abdominal pain.  No symptoms of UTI.  She states that she drinks vodka, 3 times per week, 1 to 2 glasses of vodka each time.  Patient was maintained on the medicine service for 1 midnight.  Maintain on telemetry.  Sinus rhythm with normal QTc.  Twelve-lead EKG on the day of discharge to [facility] reveals the same.  Patient appropriate for DC to inpatient behavioral health unit at this time.  No changes made no medication regimen.    Discharge Diagnoses:  Active Problems:   Bipolar affective disorder, depressed, severe (HCC)   Attention deficit hyperactivity disorder (ADHD)   GAD (generalized anxiety disorder)   PTSD (post-traumatic stress disorder)   Alcohol use   Hypokalemia  Intentional overdose Patient took an excessive amount of Wellbutrin and Seroquel.  Seen by inpatient psychiatry.  Deemed appropriate for discharge to inpatient BHU.  Was monitored on medicine service overnight.  Normal EKG reviewed and  confirmed for discharge.  Discharge Instructions  Discharge Instructions     Diet - low sodium heart healthy   Complete by: As directed    Increase activity slowly   Complete by: As directed       Allergies as of 06/24/2022   No Known Allergies      Medication List     TAKE these medications    amphetamine-dextroamphetamine 30 MG 24 hr capsule Commonly known as: ADDERALL XR Take 30 mg by mouth every morning.   ARIPiprazole 5 MG tablet Commonly known as: ABILIFY Take 5 mg by mouth daily.   buPROPion 150 MG 24 hr tablet Commonly known as: WELLBUTRIN XL Take 150 mg by mouth daily.   escitalopram 20 MG tablet Commonly known as: LEXAPRO Take 20 mg by mouth daily.        No Known Allergies  Consultations: Psychiatry   Procedures/Studies: No results found.    Subjective: Seen and examined on day of discharge.  Stable no distress.  Twelve-lead EKG reviewed.  Stable for DC to inpatient BHU.  Discharge Exam: Vitals:   06/23/22 2055 06/24/22 0503  BP: 105/60 (!) 106/56  Pulse: 79 72  Resp: 18 18  Temp: 98.1 F (36.7 C) 98 F (36.7 C)  SpO2: 99% 99%   Vitals:   06/23/22 1300 06/23/22 1534 06/23/22 2055 06/24/22 0503  BP: 101/66 112/64 105/60 (!) 106/56  Pulse: 82 92 79 72  Resp: 12 16 18 18   Temp:  98.5 F (36.9 C) 98.1 F (36.7 C) 98 F (36.7 C)  TempSrc:  Oral Oral Oral  SpO2: 99% 99% 99% 99%  Weight:  Height:        General: Pt is alert, awake, not in acute distress Cardiovascular: RRR, S1/S2 +, no rubs, no gallops Respiratory: CTA bilaterally, no wheezing, no rhonchi Abdominal: Soft, NT, ND, bowel sounds + Extremities: no edema, no cyanosis    The results of significant diagnostics from this hospitalization (including imaging, microbiology, ancillary and laboratory) are listed below for reference.     Microbiology: Recent Results (from the past 240 hour(s))  Resp Panel by RT-PCR (Flu A&B, Covid) Anterior Nasal Swab      Status: None   Collection Time: 06/23/22  4:17 AM   Specimen: Anterior Nasal Swab  Result Value Ref Range Status   SARS Coronavirus 2 by RT PCR NEGATIVE NEGATIVE Final    Comment: (NOTE) SARS-CoV-2 target nucleic acids are NOT DETECTED.  The SARS-CoV-2 RNA is generally detectable in upper respiratory specimens during the acute phase of infection. The lowest concentration of SARS-CoV-2 viral copies this assay can detect is 138 copies/mL. A negative result does not preclude SARS-Cov-2 infection and should not be used as the sole basis for treatment or other patient management decisions. A negative result may occur with  improper specimen collection/handling, submission of specimen other than nasopharyngeal swab, presence of viral mutation(s) within the areas targeted by this assay, and inadequate number of viral copies(<138 copies/mL). A negative result must be combined with clinical observations, patient history, and epidemiological information. The expected result is Negative.  Fact Sheet for Patients:  EntrepreneurPulse.com.au  Fact Sheet for Healthcare Providers:  IncredibleEmployment.be  This test is no t yet approved or cleared by the [place] FDA and  has been authorized for detection and/or diagnosis of SARS-CoV-2 by FDA under an Emergency Use Authorization (EUA). This EUA will remain  in effect (meaning this test can be used) for the duration of the COVID-19 declaration under Section 564(b)(1) of the Act, 21 U.S.C.section 360bbb-3(b)(1), unless the authorization is terminated  or revoked sooner.       Influenza A by PCR NEGATIVE NEGATIVE Final   Influenza B by PCR NEGATIVE NEGATIVE Final    Comment: (NOTE) The Xpert Xpress SARS-CoV-2/FLU/RSV plus assay is intended as an aid in the diagnosis of influenza from Nasopharyngeal swab specimens and should not be used as a sole basis for treatment. Nasal washings and aspirates are  unacceptable for Xpert Xpress SARS-CoV-2/FLU/RSV testing.  Fact Sheet for Patients: EntrepreneurPulse.com.au  Fact Sheet for Healthcare Providers: IncredibleEmployment.be  This test is not yet approved or cleared by the [place] FDA and has been authorized for detection and/or diagnosis of SARS-CoV-2 by FDA under an Emergency Use Authorization (EUA). This EUA will remain in effect (meaning this test can be used) for the duration of the COVID-19 declaration under Section 564(b)(1) of the Act, 21 U.S.C. section 360bbb-3(b)(1), unless the authorization is terminated or revoked.  Performed at [facility], [address] [place], [place] [address]      Labs: BNP (last 3 results) No results for input(s): "BNP" in the last 8760 hours. Basic Metabolic Panel: Recent Labs  Lab 06/23/22 0417 06/24/22 0459  NA 139 138  K 3.4* 3.6  CL 107 110  CO2 22 23  GLUCOSE 94 93  BUN 13 14  CREATININE 0.89 0.90  CALCIUM 9.2 8.6*  MG 2.2  --    Liver Function Tests: Recent Labs  Lab 06/23/22 0417  AST 26  ALT 14  ALKPHOS 40  BILITOT 0.4  PROT 7.8  ALBUMIN 4.4   No  results for input(s): "LIPASE", "AMYLASE" in the last 168 hours. No results for input(s): "AMMONIA" in the last 168 hours. CBC: Recent Labs  Lab 06/23/22 0417  WBC 8.0  NEUTROABS 5.1  HGB 13.6  HCT 40.3  MCV 101.3*  PLT 263   Cardiac Enzymes: No results for input(s): "CKTOTAL", "CKMB", "CKMBINDEX", "TROPONINI" in the last 168 hours. BNP: Invalid input(s): "POCBNP" CBG: No results for input(s): "GLUCAP" in the last 168 hours. D-Dimer No results for input(s): "DDIMER" in the last 72 hours. Hgb A1c No results for input(s): "HGBA1C" in the last 72 hours. Lipid Profile No results for input(s): "CHOL", "HDL", "LDLCALC", "TRIG", "CHOLHDL", "LDLDIRECT" in the last 72 hours. Thyroid function studies No results for input(s): "TSH", "T4TOTAL", "T3FREE", "THYROIDAB"  in the last 72 hours.  Invalid input(s): "FREET3" Anemia work up No results for input(s): "VITAMINB12", "FOLATE", "FERRITIN", "TIBC", "IRON", "RETICCTPCT" in the last 72 hours. Urinalysis    Component Value Date/Time   COLORURINE COLORLESS (A) 06/23/2022 0350   APPEARANCEUR CLEAR (A) 06/23/2022 0350   LABSPEC 1.001 (L) 06/23/2022 0350   PHURINE 6.0 06/23/2022 0350   GLUCOSEU NEGATIVE 06/23/2022 0350   HGBUR NEGATIVE 06/23/2022 0350   BILIRUBINUR NEGATIVE 06/23/2022 0350   KETONESUR NEGATIVE 06/23/2022 0350   PROTEINUR NEGATIVE 06/23/2022 0350   NITRITE NEGATIVE 06/23/2022 0350   LEUKOCYTESUR NEGATIVE 06/23/2022 0350   Sepsis Labs Recent Labs  Lab 06/23/22 0417  WBC 8.0   Microbiology Recent Results (from the past 240 hour(s))  Resp Panel by RT-PCR (Flu A&B, Covid) Anterior Nasal Swab     Status: None   Collection Time: 06/23/22  4:17 AM   Specimen: Anterior Nasal Swab  Result Value Ref Range Status   SARS Coronavirus 2 by RT PCR NEGATIVE NEGATIVE Final    Comment: (NOTE) SARS-CoV-2 target nucleic acids are NOT DETECTED.  The SARS-CoV-2 RNA is generally detectable in upper respiratory specimens during the acute phase of infection. The lowest concentration of SARS-CoV-2 viral copies this assay can detect is 138 copies/mL. A negative result does not preclude SARS-Cov-2 infection and should not be used as the sole basis for treatment or other patient management decisions. A negative result may occur with  improper specimen collection/handling, submission of specimen other than nasopharyngeal swab, presence of viral mutation(s) within the areas targeted by this assay, and inadequate number of viral copies(<138 copies/mL). A negative result must be combined with clinical observations, patient history, and epidemiological information. The expected result is Negative.  Fact Sheet for Patients:  BloggerCourse.com  Fact Sheet for Healthcare Providers:   SeriousBroker.it  This test is no t yet approved or cleared by the [place] FDA and  has been authorized for detection and/or diagnosis of SARS-CoV-2 by FDA under an Emergency Use Authorization (EUA). This EUA will remain  in effect (meaning this test can be used) for the duration of the COVID-19 declaration under Section 564(b)(1) of the Act, 21 U.S.C.section 360bbb-3(b)(1), unless the authorization is terminated  or revoked sooner.       Influenza A by PCR NEGATIVE NEGATIVE Final   Influenza B by PCR NEGATIVE NEGATIVE Final    Comment: (NOTE) The Xpert Xpress SARS-CoV-2/FLU/RSV plus assay is intended as an aid in the diagnosis of influenza from Nasopharyngeal swab specimens and should not be used as a sole basis for treatment. Nasal washings and aspirates are unacceptable for Xpert Xpress SARS-CoV-2/FLU/RSV testing.  Fact Sheet for Patients: BloggerCourse.com  Fact Sheet for Healthcare Providers: SeriousBroker.it  This test is not yet  approved or cleared by the [place] and has been authorized for detection and/or diagnosis of SARS-CoV-2 by FDA under an Emergency Use Authorization (EUA). This EUA will remain in effect (meaning this test can be used) for the duration of the COVID-19 declaration under Section 564(b)(1) of the Act, 21 U.S.C. section 360bbb-3(b)(1), unless the authorization is terminated or revoked.  Performed at [facility], [address] [place], [place] [address]      Time coordinating discharge: Over 30 minutes  SIGNED:   Tresa Moore, MD  [facility] 06/24/2022, 12:13 PM Pager   If 7PM-7AM, please contact night-coverage

## 2022-06-24 NOTE — Progress Notes (Signed)
Received IVC rescind paperwork. Paperwork placed on chart.

## 2022-06-24 NOTE — Progress Notes (Signed)
Patient discharged in stable condition with mother and brother. Patient ambulated out of facility with RN. Patient states she will have a follow up within 2 weeks to address pending medical withdraw from school. Patient states she has safety plan in place, mother agrees.   Belongings previously returned to patient.

## 2022-06-25 NOTE — Consult Note (Cosign Needed Addendum)
[facility] Face-to-Face Psychiatry Consult   Reason for Consult:  alcohol intoxication and overdose Referring Physician:  Dr Priscella Mann Patient Identification: Denise Jordan MRN:  [id number] Principal Diagnosis: Bipolar affective disorder, currently depressed, moderate ([place]) Diagnosis:  Principal Problem:   Bipolar affective disorder, currently depressed, moderate ([place]) Active Problems:   PTSD (post-traumatic stress disorder)   Attention deficit hyperactivity disorder (ADHD)   GAD (generalized anxiety disorder)   Hypokalemia   Alcohol use   Total Time spent with patient: 45 minutes  Subjective:   Denise Jordan is a 21 y.o. female patient admitted with alcohol intoxication and overdose.  HPI:  21 yo female presented to the ED after drinking alcohol and overdosing on Wellbutrin and Lexapro.  She got upset with her friends when she was out drinking when this occurred.  Today, she was offered a bed at [facility] inpatient psych unit when this provider was chatted that she wanted [place] to contact her mother, Denise Jordan.  This provider called her and she was on her way from [place] and wanted to talk to her daughter before making any decisions but did not want her to stay in the hospital in [place].  The RN notified when the client was there so the psych team could meet with her, this provider and TTS Denise Jordan).  Dr. Weber Cooks reviewed this case and concurs with discharge as long as everyone is agreeable with a safety plan and risks discussed.  The team met with the client who was regretted her actions and glad her attempt did not work.  She denies any past history of suicide attempts.  Her brother came from [place] where he lives and her mother came from [place].  She felt "overwhelmed" at the time of her overdose.  Today, she clearly communicated her feelings and regrets over her actions. Denies alcohol being an issue for her and realizes the negative effect it had on her. Explained the options for her care of going inpatient at [facility], PHP,  IOP, or residential.  Her mother had heard of the [facility] in [place] and wanted to explore this venue.  She requested to speak with her daughter prior to making any decision and wanted her daughter's input.  Her mother was upset about the IVC being in place and a sitter at her bedside as she did not feel it was necessary until this provider explained the rationales of this interventions.  She was reasonable when also explained it could be reversed.  Denise Jordan asked for it to be removed and contracted for safety.  She and her mother will talk and decide on the best course of action for her care.  The team did discuss the risks of discharging the client with her mother agreeable to be with her if this is decided.  Meanwhile, the IVC was resended.    The RN notified this PMHNP that the patient and mother requested discharge with the attending writing her out of school for two weeks as they pursued a medical withdrawal for Denise Jordan to get the help she needs.  The mother is going to explore the [facility] and other options in the [place] area with her daughter.  She plans to be with her and the client contracts for safety.  The client has a therapist and provider in place as well.  Discussed returning to any ED if her suicidal ideations reoccur or if she changes her mind and wants inpatient hospitalization. Denise Jordan is agreeable with seeking help if symptoms return or increase via ED, 911, or 988.  The family, Denise Jordan, and the psych team are agreeable to to the plan.  Resources and assistance in finding a place offered along with contact phone numbers.  Her mother expressed appreciation and would reach out if she needed anything.  Past Psychiatric History: bipolar d/o, PTSD, depression, anxiety  Risk to Self:  none Risk to Others:  none Prior Inpatient Therapy:  none Prior Outpatient Therapy:  therapist and provider.  Past Medical History:  Past Medical History:  Diagnosis Date   ADHD    Alcohol use    Bipolar disorder ([place])     PTSD (post-traumatic stress disorder)    History reviewed. No pertinent surgical history. Family History:  Family History  Problem Relation Age of Onset   Stroke Mother    Kidney cancer Father    Family Psychiatric  History: none Social History:  Social History   Substance and Sexual Activity  Alcohol Use Yes     Social History   Substance and Sexual Activity  Drug Use Never    Social History   Socioeconomic History   Marital status: Single    Spouse name: Not on file   Number of children: Not on file   Years of education: Not on file   Highest education level: Not on file  Occupational History   Not on file  Tobacco Use   Smoking status: Never   Smokeless tobacco: Never  Vaping Use   Vaping Use: Never used  Substance and Sexual Activity   Alcohol use: Yes   Drug use: Never   Sexual activity: Not on file  Other Topics Concern   Not on file  Social History Narrative   Not on file   Social Determinants of Health   Financial Resource Strain: Not on file  Food Insecurity: No Food Insecurity (06/23/2022)   Hunger Vital Sign    Worried About Running Out of Food in the Last Year: Never true    Ran Out of Food in the Last Year: Never true  Transportation Needs: No Transportation Needs (06/23/2022)   PRAPARE - Hydrologist (Medical): No    Lack of Transportation (Non-Medical): No  Physical Activity: Not on file  Stress: Not on file  Social Connections: Not on file   Additional Social History:    Allergies:  No Known Allergies  Labs:  Results for orders placed or performed during the hospital encounter of 06/23/22 (from the past 48 hour(s))  Acetaminophen level     Status: Abnormal   Collection Time: 06/23/22  8:12 AM  Result Value Ref Range   Acetaminophen (Tylenol), Serum <10 (L) 10 - 30 ug/mL    Comment: (NOTE) Therapeutic concentrations vary significantly. A range of 10-30 ug/mL  may be an effective concentration for many  patients. However, some  are best treated at concentrations outside of this range. Acetaminophen concentrations >150 ug/mL at 4 hours after ingestion  and >50 ug/mL at 12 hours after ingestion are often associated with  toxic reactions.  Performed at [facility] [address] [place], [place] [address]   Basic metabolic panel     Status: Abnormal   Collection Time: 06/24/22  4:59 AM  Result Value Ref Range   Sodium 138 135 - 145 mmol/L   Potassium 3.6 3.5 - 5.1 mmol/L   Chloride 110 98 - 111 mmol/L   CO2 23 22 - 32 mmol/L   Glucose, Bld 93 70 - 99 mg/dL    Comment: Glucose reference  range applies only to samples taken after fasting for at least 8 hours.   BUN 14 6 - 20 mg/dL   Creatinine, Ser 0.90 0.44 - 1.00 mg/dL   Calcium 8.6 (L) 8.9 - 10.3 mg/dL   GFR, Estimated >60 >60 mL/min    Comment: (NOTE) Calculated using the CKD-EPI Creatinine Equation (2021)    Anion gap 5 5 - 15    Comment: Performed at [facility], [address] [place], [place] [address]    No current facility-administered medications for this encounter.   Current Outpatient Medications  Medication Sig Dispense Refill   amphetamine-dextroamphetamine (ADDERALL XR) 30 MG 24 hr capsule Take 30 mg by mouth every morning.     ARIPiprazole (ABILIFY) 5 MG tablet Take 5 mg by mouth daily.     buPROPion (WELLBUTRIN XL) 150 MG 24 hr tablet Take 150 mg by mouth daily.     escitalopram (LEXAPRO) 20 MG tablet Take 20 mg by mouth daily.      Musculoskeletal: Strength & Muscle Tone: within normal limits Gait & Station: normal Patient leans: N/A  Psychiatric Specialty Exam: Physical Exam Vitals and nursing note reviewed.  Constitutional:      Appearance: Normal appearance.  HENT:     Head: Normocephalic.     Nose: Nose normal.  Pulmonary:     Effort: Pulmonary effort is normal.  Musculoskeletal:        General: Normal range of motion.     Cervical back: Normal range of motion.   Neurological:     General: No focal deficit present.     Mental Status: She is alert and oriented to person, place, and time.  Psychiatric:        Attention and Perception: Attention and perception normal.        Mood and Affect: Mood is anxious and depressed.        Speech: Speech normal.        Behavior: Behavior normal. Behavior is cooperative.        Thought Content: Thought content normal.        Cognition and Memory: Cognition and memory normal.        Judgment: Judgment is impulsive.     Review of Systems  Psychiatric/Behavioral:  Positive for depression and substance abuse. The patient is nervous/anxious.   All other systems reviewed and are negative.   Blood pressure 111/61, pulse 78, temperature 98.4 F (36.9 C), resp. rate 18, height 5' 5" (1.651 m), weight 81.6 kg, SpO2 99 %.Body mass index is 29.95 kg/m.  General Appearance: Disheveled  Eye Contact:  Good  Speech:  Normal Rate  Volume:  Normal  Mood:  Anxious and Depressed  Affect:  Congruent  Thought Process:  Coherent  Orientation:  Full (Time, Place, and Person)  Thought Content:  Logical  Suicidal Thoughts:  No  Homicidal Thoughts:  No  Memory:  Immediate;   Good Recent;   Good Remote;   Good  Judgement:  Fair  Insight:  Good  Psychomotor Activity:  Decreased  Concentration:  Concentration: Good and Attention Span: Good  Recall:  Good  Fund of Knowledge:  Good  Language:  Good  Akathisia:  No  Handed:  Right  AIMS (if indicated):     Assets:  Leisure Time Physical Health Resilience Social Support  ADL's:  Intact  Cognition:  WNL  Sleep:        Physical Exam: Physical Exam Vitals and nursing note reviewed.  Constitutional:  Appearance: Normal appearance.  HENT:     Head: Normocephalic.     Nose: Nose normal.  Pulmonary:     Effort: Pulmonary effort is normal.  Musculoskeletal:        General: Normal range of motion.     Cervical back: Normal range of motion.  Neurological:      General: No focal deficit present.     Mental Status: She is alert and oriented to person, place, and time.  Psychiatric:        Attention and Perception: Attention and perception normal.        Mood and Affect: Mood is anxious and depressed.        Speech: Speech normal.        Behavior: Behavior normal. Behavior is cooperative.        Thought Content: Thought content normal.        Cognition and Memory: Cognition and memory normal.        Judgment: Judgment is impulsive.    Review of Systems  Psychiatric/Behavioral:  Positive for depression and substance abuse. The patient is nervous/anxious.   All other systems reviewed and are negative.  Blood pressure 111/61, pulse 78, temperature 98.4 F (36.9 C), resp. rate 18, height 5' 5" (1.651 m), weight 81.6 kg, SpO2 99 %. Body mass index is 29.95 kg/m.  Treatment Plan Summary: Bipolar affective disorder, depressed, moderate: Follow up with outpatient therapist and provider to develop a treatment plan with the recommendation of residential, PHP, and/or IOP to assist with her recovery in [place].  Disposition: No evidence of imminent risk to self or others at present.   Patient does not meet criteria for psychiatric inpatient admission.  Waylan Boga, NP 06/25/2022 6:26 AM

## 2022-09-08 ENCOUNTER — Encounter
Admit: 2022-09-08 | Payer: Managed Care (Private) | Attending: Internal Medicine | Primary: Student in an Organized Health Care Education/Training Program

## 2023-01-08 ENCOUNTER — Encounter
Admit: 2023-01-08 | Payer: Managed Care (Private) | Attending: Student in an Organized Health Care Education/Training Program | Primary: Student in an Organized Health Care Education/Training Program

## 2023-01-08 ENCOUNTER — Inpatient Hospital Stay
Admit: 2023-01-08 | Discharge: 2023-01-08 | Payer: PRIVATE HEALTH INSURANCE | Primary: Student in an Organized Health Care Education/Training Program

## 2023-01-08 ENCOUNTER — Encounter
Admit: 2023-01-08 | Payer: PRIVATE HEALTH INSURANCE | Attending: Student in an Organized Health Care Education/Training Program | Primary: Student in an Organized Health Care Education/Training Program

## 2023-01-08 DIAGNOSIS — R7401 Elevation of levels of liver transaminase levels: Secondary | ICD-10-CM

## 2023-01-08 DIAGNOSIS — R5383 Other fatigue: Secondary | ICD-10-CM

## 2023-01-08 MED ORDER — DEXTROAMPHETAMINE-AMPHETAMINE ER 30 MG 24HR CAPSULE,EXTEND RELEASE
30 | Freq: Every morning | ORAL | 1.00 refills | 30.00000 days | Status: AC
Start: 2023-01-08 — End: 2023-08-21

## 2023-01-09 LAB — COMPREHENSIVE METABOLIC PANEL
BKR A/G RATIO: 1.4
BKR ALANINE AMINOTRANSFERASE (ALT): 16 U/L (ref 12–78)
BKR ALBUMIN: 4.2 g/dL (ref 3.4–5.0)
BKR ALKALINE PHOSPHATASE: 52 U/L (ref 50–335)
BKR ANION GAP: 8 (ref 5–18)
BKR ASPARTATE AMINOTRANSFERASE (AST): 15 U/L (ref 5–37)
BKR AST/ALT RATIO: 0.9
BKR BILIRUBIN TOTAL: 0.5 mg/dL (ref 0.0–1.0)
BKR BLOOD UREA NITROGEN: 11 mg/dL (ref 8–25)
BKR BUN / CREAT RATIO: 10.2 (ref 8.0–25.0)
BKR CALCIUM: 9.2 mg/dL (ref 8.4–10.3)
BKR CHLORIDE: 106 mmol/L (ref 95–115)
BKR CO2: 26 mmol/L (ref 21–32)
BKR CREATININE: 1.08 mg/dL (ref 0.50–1.30)
BKR EGFR, CREATININE (CKD-EPI 2021): >60 mL/min/{1.73_m2} (ref >=60)
BKR GLOBULIN: 3 g/dL
BKR GLUCOSE: 92 mg/dL (ref 70–100)
BKR OSMOLALITY CALCULATION: 278 mOsm/kg (ref 275–295)
BKR POTASSIUM: 4.2 mmol/L (ref 3.5–5.1)
BKR PROTEIN TOTAL: 7.2 g/dL (ref 6.4–8.2)
BKR SODIUM: 140 mmol/L (ref 136–145)

## 2023-01-09 LAB — VITAMIN B12: BKR VITAMIN B12: 272 pg/mL (ref 211–911)

## 2023-01-09 LAB — TSH W/REFLEX TO FT4     (BH GH LMW Q YH): BKR THYROID STIMULATING HORMONE: 1.31 ??IU/mL (ref 0.358–3.740)

## 2023-01-09 LAB — VITAMIN D, 25-HYDROXY: BKR VITAMIN D 25-HYDROXY TOTAL: 52 ng/mL — ABNORMAL HIGH (ref 20–50)

## 2023-03-12 ENCOUNTER — Encounter
Admit: 2023-03-12 | Payer: PRIVATE HEALTH INSURANCE | Attending: Family Medicine | Primary: Student in an Organized Health Care Education/Training Program

## 2023-03-12 ENCOUNTER — Ambulatory Visit
Admit: 2023-03-12 | Payer: PRIVATE HEALTH INSURANCE | Attending: Family Medicine | Primary: Student in an Organized Health Care Education/Training Program

## 2023-03-12 DIAGNOSIS — G43009 Migraine without aura, not intractable, without status migrainosus: Secondary | ICD-10-CM

## 2023-03-12 DIAGNOSIS — R251 Tremor, unspecified: Secondary | ICD-10-CM

## 2023-03-12 DIAGNOSIS — F32A Depression: Secondary | ICD-10-CM

## 2023-03-12 DIAGNOSIS — G44209 Tension-type headache, unspecified, not intractable: Secondary | ICD-10-CM

## 2023-03-12 DIAGNOSIS — F419 Anxiety disorder, unspecified: Secondary | ICD-10-CM

## 2023-03-12 DIAGNOSIS — F909 Attention-deficit hyperactivity disorder, unspecified type: Secondary | ICD-10-CM

## 2023-03-12 DIAGNOSIS — G4440 Drug-induced headache, not elsewhere classified, not intractable: Secondary | ICD-10-CM

## 2023-03-12 MED ORDER — RIMEGEPANT 75 MG DISINTEGRATING TABLET
75 | ORAL_TABLET | Freq: Every evening | ORAL | 5 refills | Status: AC
Start: 2023-03-12 — End: ?

## 2023-03-12 MED ORDER — VRAYLAR 1.5 MG CAPSULE
1.5 | Status: AC
Start: 2023-03-12 — End: ?

## 2023-03-12 MED ORDER — TOPIRAMATE 25 MG TABLET
25 | ORAL_TABLET | ORAL | 1 refills | Status: AC
Start: 2023-03-12 — End: ?

## 2023-03-12 NOTE — Patient Instructions
-   Maintain a detailed headache journal - onset, duration, location, quality, trigger, treatment, pain level. - For headache prevention: Start Topamax 1 tablet (25 mg total) nightly for 7 days, THEN 2 tablets (50 mg total) nightly for 7 days, THEN 3 tablets (75 mg total) nightly for 7 days, THEN 4 tablets (100 mg total) nightly. Once starting this medication, do not stop this medication abruptly as it can cause withdrawal seizures. - For acute treatment: Nurtec 75mg  as needed; Limit to 1 tablet in 24hrs. Continue taking the Tylenol as needed, but limit it's use to 2 times per week. - For nausea: She can try ginger tea, but in case she develops bothersome nausea then she is recommended to take an OTC antiemetic.- Return in about 3 months (around 06/12/2023).Occipital Nerve blocks every 2 weeks if needed. Most effective if you have a headache and are tender in the occipital region.  If migraine persists beyond 2 days call to schedule an infusion/nerve blocks to try and break the headache cycle. Remember that preventative medications usually take at least 4-12 weeks to be effective in reducing the frequency of your headaches.Please keep a headache diary, documenting when your headaches occur and any possible triggers. Bring this with you to your appointments.Triggers to watch for include stress, oversleeping or not sleeping enough, certain foods, MSG, will alcohol, too much caffeine, skipping meals, change in barometric pressure, menstrual cycles.If you are a smoker: Quitting smoking is an important step in treating your headache. If you need help quitting, please ask!  Resources are available.If your medication does not seem to be working, or if you're experiencing side effects, please call my office, Monday through Friday, 8 AM to 5 PM (calling early in the day is best), and we can talk about possible medication adjustments over the phone.Migraine with AuraIf you have a history of migraines with aura, this places you at a 2 fold increased risk of stroke over an age matched population. I recommend that your primary care physician evaluates your stroke risk factors including Lipids, HbA1C and Blood Pressure. Also estrogen increases the stroke risk 6 fold and smoking increased the risk 9 fold.Medication Overuse Headache Prevention:Please avoid the use of over the counter medications such as tylenol, ibuprofen, excedrin, narcotics and opoid for the management of migraines as they can lead to Medication Overuse Headache which is very difficult to treat.

## 2023-03-13 ENCOUNTER — Encounter
Admit: 2023-03-13 | Payer: Managed Care (Private) | Attending: Internal Medicine | Primary: Student in an Organized Health Care Education/Training Program

## 2023-03-13 ENCOUNTER — Encounter
Admit: 2023-03-13 | Payer: PRIVATE HEALTH INSURANCE | Primary: Student in an Organized Health Care Education/Training Program

## 2023-03-16 ENCOUNTER — Encounter
Admit: 2023-03-16 | Payer: Managed Care (Private) | Attending: Student in an Organized Health Care Education/Training Program | Primary: Student in an Organized Health Care Education/Training Program
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
## 2023-06-03 ENCOUNTER — Encounter
Admit: 2023-06-03 | Payer: PRIVATE HEALTH INSURANCE | Attending: Family Medicine | Primary: Student in an Organized Health Care Education/Training Program

## 2023-06-05 ENCOUNTER — Encounter
Admit: 2023-06-05 | Payer: PRIVATE HEALTH INSURANCE | Primary: Student in an Organized Health Care Education/Training Program

## 2023-06-05 NOTE — Telephone Encounter
 Pending reply from pt on current dose

## 2023-06-06 ENCOUNTER — Encounter
Admit: 2023-06-06 | Payer: PRIVATE HEALTH INSURANCE | Attending: Family Medicine | Primary: Student in an Organized Health Care Education/Training Program

## 2023-06-06 DIAGNOSIS — G43009 Migraine without aura, not intractable, without status migrainosus: Secondary | ICD-10-CM

## 2023-06-06 MED ORDER — TOPIRAMATE 25 MG TABLET
25 | ORAL_TABLET | Freq: Every evening | ORAL | 4 refills | Status: AC
Start: 2023-06-06 — End: ?

## 2023-06-06 MED ORDER — RIMEGEPANT 75 MG DISINTEGRATING TABLET
75 | ORAL_TABLET | Freq: Every evening | ORAL | 5 refills | Status: AC
Start: 2023-06-06 — End: ?

## 2023-06-11 ENCOUNTER — Encounter
Admit: 2023-06-11 | Payer: Managed Care (Private) | Attending: Student in an Organized Health Care Education/Training Program | Primary: Student in an Organized Health Care Education/Training Program

## 2023-06-12 ENCOUNTER — Encounter
Admit: 2023-06-12 | Payer: PRIVATE HEALTH INSURANCE | Attending: Family Medicine | Primary: Student in an Organized Health Care Education/Training Program

## 2023-06-12 DIAGNOSIS — R55 Syncope and collapse: Secondary | ICD-10-CM

## 2023-06-12 DIAGNOSIS — G43009 Migraine without aura, not intractable, without status migrainosus: Secondary | ICD-10-CM

## 2023-06-12 DIAGNOSIS — R110 Nausea: Secondary | ICD-10-CM

## 2023-06-12 DIAGNOSIS — G44209 Tension-type headache, unspecified, not intractable: Secondary | ICD-10-CM

## 2023-06-12 NOTE — Patient Instructions
-   Maintain a detailed headache journal - onset, duration, location, quality, trigger, treatment, pain level. - Lifestyle modifications including healthy diet, weight loss, sleep, hygiene, caffeine limitation, hydration, discussed with patient. - vascular risk factors reviewed- advised to continue monitoring of BP, cholesterol, glucose with PCP. Advised to avoid estrogen containing OCPs due to stroke risk associated with aura. Patient verbalized understanding. - For headache prevention: Start Magnesium Oxide 400 mg daily and Vitamin B2 400 mg daily. Continue Topamax 25 mg and take 50 mg nightly. - For acute treatment: Continue Nurtec 75mg  as needed; Limit to 1 tablet in 24hrs. Continue taking the Tylenol as needed, but limit it's use to 2 times per week. - For nausea: She can try ginger tea, but in case she develops bothersome nausea then she is recommended to take an OTC antiemetic.- Return in about 6 months (around 12/11/2023).
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
## 2023-07-10 ENCOUNTER — Encounter
Admit: 2023-07-10 | Payer: PRIVATE HEALTH INSURANCE | Primary: Student in an Organized Health Care Education/Training Program

## 2023-07-10 ENCOUNTER — Telehealth
Admit: 2023-07-10 | Payer: PRIVATE HEALTH INSURANCE | Attending: Student in an Organized Health Care Education/Training Program | Primary: Student in an Organized Health Care Education/Training Program

## 2023-07-10 NOTE — Telephone Encounter
 Nurse Triage Christoper Allegra called in reporting that whenever she is cold her hands/feet turn white and she developed numbness.  She denies any hemiparesis or current headaches, dizziness or slurred speech.  She reports these symptoms have been ongoing since last winter.  She was also reporting excessive sweating which has been occurring for many months per patient.  She was scheduled for visit with covering provider 07/12/23 at 11:00 am.  Patient advised if symptoms worsen or new symptoms develop to call back office. Patient verbalized understanding. DispositionPatient triaged using Schmitt-Thompson, per protocol patient referred to the office, appointment scheduled with a covering provider due to no availability with PCP.Care advice provided to the patient. Advised the patient to call back with any new or worsening symptoms. Patient verbalizes understanding.Escalation required: no Reason for Disposition Numbness or tingling in one or both feet is a chronic symptom (recurrent or ongoing problem lasting > 4 weeks)Protocols used: Neurologic Deficit-Adult-[place]

## 2023-07-10 NOTE — Telephone Encounter
Error

## 2023-07-12 ENCOUNTER — Inpatient Hospital Stay
Admit: 2023-07-12 | Discharge: 2023-07-12 | Payer: PRIVATE HEALTH INSURANCE | Primary: Student in an Organized Health Care Education/Training Program

## 2023-07-12 ENCOUNTER — Encounter
Admit: 2023-07-12 | Payer: Managed Care (Private) | Primary: Student in an Organized Health Care Education/Training Program

## 2023-07-12 DIAGNOSIS — R200 Anesthesia of skin: Secondary | ICD-10-CM

## 2023-07-12 DIAGNOSIS — I7300 Raynaud's syndrome without gangrene: Secondary | ICD-10-CM

## 2023-07-12 DIAGNOSIS — R202 Paresthesia of skin: Secondary | ICD-10-CM

## 2023-07-12 LAB — TSH W/REFLEX TO FT4     (BH GH LMW Q YH): BKR THYROID STIMULATING HORMONE: 2.47 u[IU]/mL (ref 0.358–3.740)

## 2023-07-13 LAB — FOLATE: BKR FOLATE: 28.3 ng/mL

## 2023-07-13 LAB — VITAMIN B12: BKR VITAMIN B12: 633 pg/mL (ref 211–911)

## 2023-07-13 NOTE — Other
 Vitamin B12 wnlFolate wnlTSH wnl

## 2023-07-16 LAB — ANA IFA SCREEN W/REFL TO TITER AND PATTERN, IFA: BKR ANTI-NUCLEAR ANTIBODY (ANA): POSITIVE — AB

## 2023-07-16 LAB — ANA TITER AND PATTERN W/RFLX   (BH GH): BKR ANA TITER: 1:640 {titer} — AB

## 2023-07-17 ENCOUNTER — Telehealth
Admit: 2023-07-17 | Payer: PRIVATE HEALTH INSURANCE | Primary: Student in an Organized Health Care Education/Training Program

## 2023-07-17 LAB — ANA PROFILE (LAB ORDERABLE ONLY) (GH)
BKR ANTI-RIBOSOMAL P: <0.2 (ref 0.0–0.9)
BKR ANTI-SM/RNP: <0.2 (ref 0.0–0.9)
BKR CENTROMERE AB SCREEN (NUMBER): <0.2 (ref 0.0–0.9)
BKR CHROMATIN (HISTONE): 0.3 (ref 0.0–0.9)
BKR DSDNA ANTIBODY: <1 [IU]/mL (ref 0.0–4.0)
BKR JO-1 IGG ANTIBODY: <0.2 (ref 0.0–0.9)
BKR RNP ANTIBODY: <0.2 (ref 0.0–0.9)
BKR SCLERODERMA (SCL-70) (ENA) AB: <0.2 (ref 0.0–0.9)
BKR SM ANTIBODY: <0.2 (ref 0.0–0.9)
BKR SS-A SJOGRENS AB: <0.2 (ref 0.0–0.9)
BKR SS-B SJOGRENS AB: <0.2 (ref 0.0–0.9)

## 2023-07-17 MED ORDER — ALUMINUM CHLORIDE 20 % TOPICAL SOLUTION
20 | Freq: Every evening | TOPICAL | 1 refills | 30.00000 days | Status: AC
Start: 2023-07-17 — End: 2023-07-25

## 2023-07-17 NOTE — Other
 ANA positive; ANA titers 1:640 elevated however ANA profile negative, recommend repeat in 3 months

## 2023-07-25 ENCOUNTER — Encounter
Admit: 2023-07-25 | Payer: PRIVATE HEALTH INSURANCE | Primary: Student in an Organized Health Care Education/Training Program

## 2023-07-25 MED ORDER — ALUMINUM CHLORIDE 20 % TOPICAL SOLUTION
20 | Freq: Every evening | TOPICAL | 1 refills | 30.00000 days | Status: AC
Start: 2023-07-25 — End: 2023-08-08

## 2023-08-01 ENCOUNTER — Encounter
Admit: 2023-08-01 | Payer: PRIVATE HEALTH INSURANCE | Primary: Student in an Organized Health Care Education/Training Program

## 2023-08-07 ENCOUNTER — Encounter
Admit: 2023-08-07 | Payer: Managed Care (Private) | Attending: Student in an Organized Health Care Education/Training Program | Primary: Student in an Organized Health Care Education/Training Program

## 2023-08-08 ENCOUNTER — Inpatient Hospital Stay
Admit: 2023-08-08 | Discharge: 2023-08-08 | Payer: PRIVATE HEALTH INSURANCE | Primary: Student in an Organized Health Care Education/Training Program

## 2023-08-08 ENCOUNTER — Ambulatory Visit
Admit: 2023-08-08 | Payer: Managed Care (Private) | Attending: Student in an Organized Health Care Education/Training Program | Primary: Student in an Organized Health Care Education/Training Program

## 2023-08-08 ENCOUNTER — Ambulatory Visit
Admit: 2023-08-08 | Payer: PRIVATE HEALTH INSURANCE | Attending: Student in an Organized Health Care Education/Training Program | Primary: Student in an Organized Health Care Education/Training Program

## 2023-08-08 ENCOUNTER — Encounter
Admit: 2023-08-08 | Payer: PRIVATE HEALTH INSURANCE | Attending: Student in an Organized Health Care Education/Training Program | Primary: Student in an Organized Health Care Education/Training Program

## 2023-08-08 DIAGNOSIS — Z0000 Encounter for general adult medical examination without abnormal findings: Secondary | ICD-10-CM

## 2023-08-08 LAB — CBC WITH AUTO DIFFERENTIAL
BKR WAM ABSOLUTE IMMATURE GRANULOCYTES.: 0.01 x 1000/ÂµL (ref 0.00–0.30)
BKR WAM ABSOLUTE LYMPHOCYTE COUNT.: 1.37 x 1000/ÂµL (ref 0.60–3.70)
BKR WAM ABSOLUTE NRBC (2 DEC): 0 x 1000/ÂµL (ref 0.00–1.00)
BKR WAM ANC (ABSOLUTE NEUTROPHIL COUNT): 2.88 x 1000/ÂµL (ref 2.00–7.60)
BKR WAM BASOPHIL ABSOLUTE COUNT.: 0.02 x 1000/ÂµL (ref 0.00–1.00)
BKR WAM BASOPHILS: 0.4 % (ref 0.0–1.4)
BKR WAM EOSINOPHIL ABSOLUTE COUNT.: 0.07 x 1000/ÂµL (ref 0.00–1.00)
BKR WAM EOSINOPHILS: 1.5 % (ref 0.0–5.0)
BKR WAM HEMATOCRIT (2 DEC): 44.4 % (ref 35.00–45.00)
BKR WAM HEMOGLOBIN: 14.3 g/dL (ref 11.7–15.5)
BKR WAM IMMATURE GRANULOCYTES: 0.2 % (ref 0.0–1.0)
BKR WAM LYMPHOCYTES: 29.2 % (ref 17.0–50.0)
BKR WAM MCH (PG): 33.4 pg — ABNORMAL HIGH (ref 27.0–33.0)
BKR WAM MCHC: 32.2 g/dL (ref 31.0–36.0)
BKR WAM MCV: 103.7 fL — ABNORMAL HIGH (ref 80.0–100.0)
BKR WAM MONOCYTE ABSOLUTE COUNT.: 0.34 x 1000/ÂµL (ref 0.00–1.00)
BKR WAM MONOCYTES: 7.2 % (ref 4.0–12.0)
BKR WAM MPV: 10.9 fL (ref 8.0–12.0)
BKR WAM NEUTROPHILS: 61.5 % (ref 39.0–72.0)
BKR WAM NUCLEATED RED BLOOD CELLS: 0 % (ref 0.0–1.0)
BKR WAM PLATELETS: 241 x1000/ÂµL (ref 150–420)
BKR WAM RDW-CV: 12 % (ref 11.0–15.0)
BKR WAM RED BLOOD CELL COUNT.: 4.28 M/ÂµL (ref 4.00–6.00)
BKR WAM WHITE BLOOD CELL COUNT: 4.7 x1000/ÂµL (ref 4.0–11.0)

## 2023-08-09 LAB — QUANTIFERON-TB
BKR QUANTIFERON-TB GOLD IN-TUBE: NEGATIVE
BKR QUANTIFERON-TB MITOGEN MINUS NIL: 5.67 [IU]/mL
BKR QUANTIFERON-TB NIL: 0.02 [IU]/mL
BKR QUANTIFERON-TB1 MINUS NIL: 0 [IU]/mL (ref <0.35)
BKR QUANTIFERON-TB2 MINUS NIL: 0 [IU]/mL (ref <0.35)

## 2023-08-11 ENCOUNTER — Encounter
Admit: 2023-08-11 | Payer: PRIVATE HEALTH INSURANCE | Attending: Student in an Organized Health Care Education/Training Program | Primary: Student in an Organized Health Care Education/Training Program

## 2023-08-21 ENCOUNTER — Encounter
Admit: 2023-08-21 | Payer: PRIVATE HEALTH INSURANCE | Attending: Student in an Organized Health Care Education/Training Program | Primary: Student in an Organized Health Care Education/Training Program

## 2023-08-21 MED ORDER — DEXTROAMPHETAMINE-AMPHETAMINE ER 30 MG 24HR CAPSULE,EXTEND RELEASE
30 | ORAL_CAPSULE | Freq: Every morning | ORAL | 1 refills | 30.00000 days | Status: AC
Start: 2023-08-21 — End: ?

## 2023-09-03 ENCOUNTER — Encounter
Admit: 2023-09-03 | Payer: PRIVATE HEALTH INSURANCE | Attending: Family Medicine | Primary: Student in an Organized Health Care Education/Training Program

## 2023-09-03 DIAGNOSIS — G43009 Migraine without aura, not intractable, without status migrainosus: Secondary | ICD-10-CM

## 2023-09-03 MED ORDER — RIMEGEPANT 75 MG DISINTEGRATING TABLET
75 | ORAL_TABLET | Freq: Every evening | ORAL | 5 refills | Status: AC
Start: 2023-09-03 — End: ?

## 2023-10-08 ENCOUNTER — Encounter
Admit: 2023-10-08 | Payer: PRIVATE HEALTH INSURANCE | Attending: Student in an Organized Health Care Education/Training Program | Primary: Student in an Organized Health Care Education/Training Program

## 2023-10-16 ENCOUNTER — Encounter
Admit: 2023-10-16 | Payer: Managed Care (Private) | Attending: Student in an Organized Health Care Education/Training Program | Primary: Student in an Organized Health Care Education/Training Program

## 2023-10-16 ENCOUNTER — Encounter
Admit: 2023-10-16 | Payer: PRIVATE HEALTH INSURANCE | Attending: Student in an Organized Health Care Education/Training Program | Primary: Student in an Organized Health Care Education/Training Program

## 2023-10-16 VITALS — BP 120/70 | HR 89 | Temp 98.10000°F | Ht 65.0 in | Wt 159.0 lb

## 2023-10-16 DIAGNOSIS — F909 Attention-deficit hyperactivity disorder, unspecified type: Secondary | ICD-10-CM

## 2023-10-16 DIAGNOSIS — F419 Anxiety disorder, unspecified: Secondary | ICD-10-CM

## 2023-10-16 DIAGNOSIS — M542 Cervicalgia: Secondary | ICD-10-CM

## 2023-10-16 DIAGNOSIS — R6884 Jaw pain: Secondary | ICD-10-CM

## 2023-10-16 DIAGNOSIS — R251 Tremor, unspecified: Secondary | ICD-10-CM

## 2023-10-16 DIAGNOSIS — F32A Depression: Secondary | ICD-10-CM

## 2023-10-16 MED ORDER — CYCLOBENZAPRINE 5 MG TABLET
5 | ORAL_TABLET | Freq: Two times a day (BID) | ORAL | 1 refills | 10.00000 days | Status: AC | PRN
Start: 2023-10-16 — End: ?

## 2023-10-16 MED ORDER — MELOXICAM 7.5 MG TABLET
7.5 | ORAL_TABLET | Freq: Every day | ORAL | 1 refills | 30.00000 days | Status: AC
Start: 2023-10-16 — End: ?

## 2023-10-29 IMAGING — CT CT ABD-PELV W/ CM
2 of 4 series · 15 of 46 positions shown, 17 images · IV contrast (APPLIED)
Comparison: None.

CLINICAL DATA: Right lower quadrant abdominal pain.

EXAM:
CT ABDOMEN AND PELVIS WITH CONTRAST
TECHNIQUE: Multidetector CT imaging of the abdomen and pelvis was performed
using the standard protocol following bolus administration of
intravenous contrast.
CONTRAST:  100mL OMNIPAQUE IOHEXOL 300 MG/ML  SOLN

[Series 2: routine abd/pel with · axial · 0.73mm/px · z∈[-1005,-570]mm · 12 of 95 slices shown, 14 images]
[im 4/95  soft-tissue]
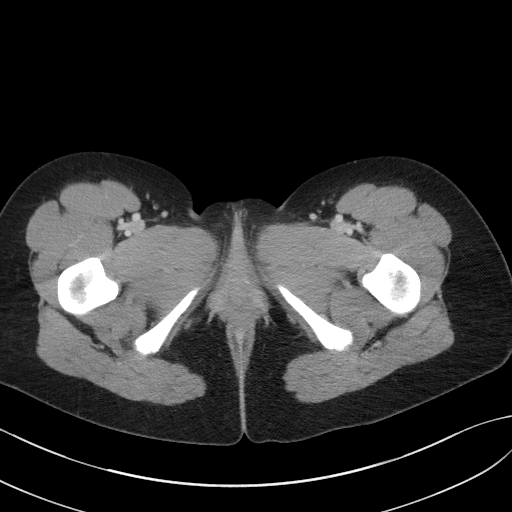
[im 4/95  bone]
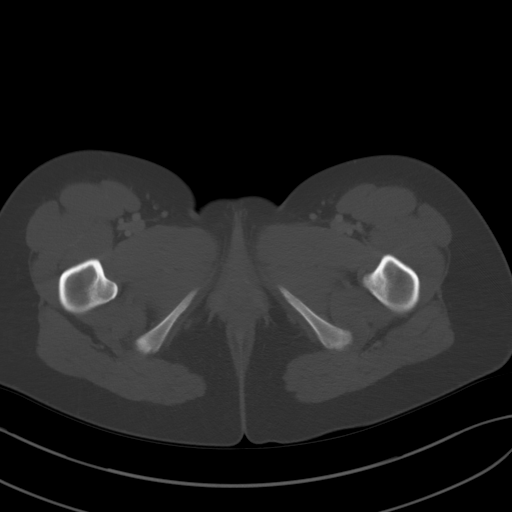
[im 12/95  soft-tissue]
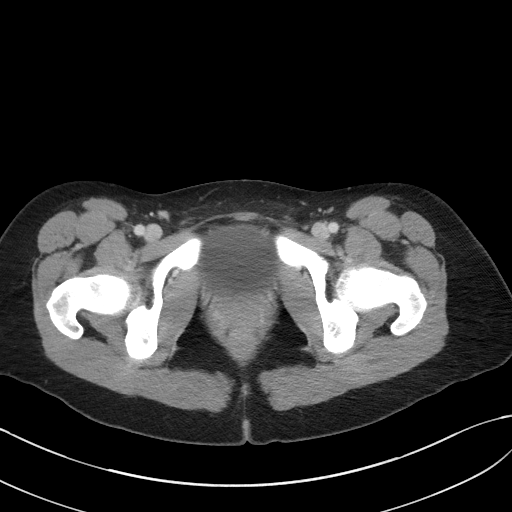
[im 20/95  soft-tissue]
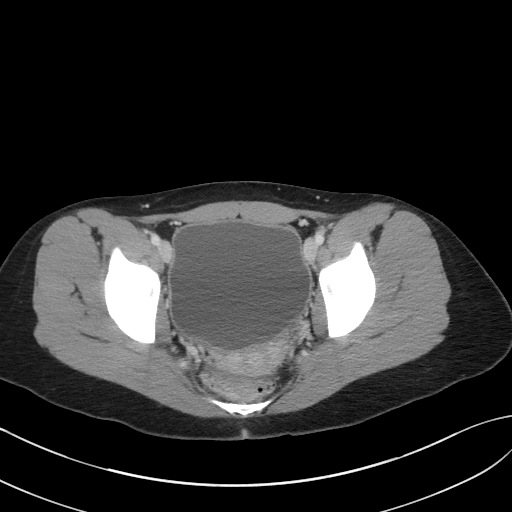
[im 28/95  soft-tissue]
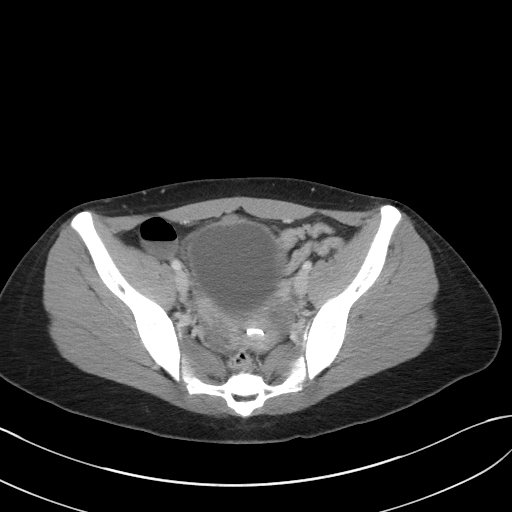
[im 36/95  soft-tissue]
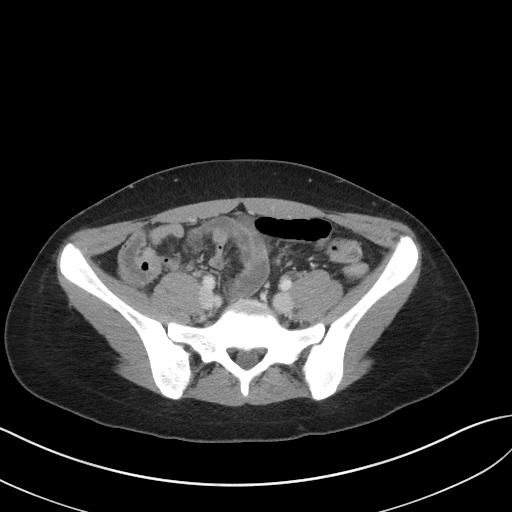
[im 44/95  soft-tissue]
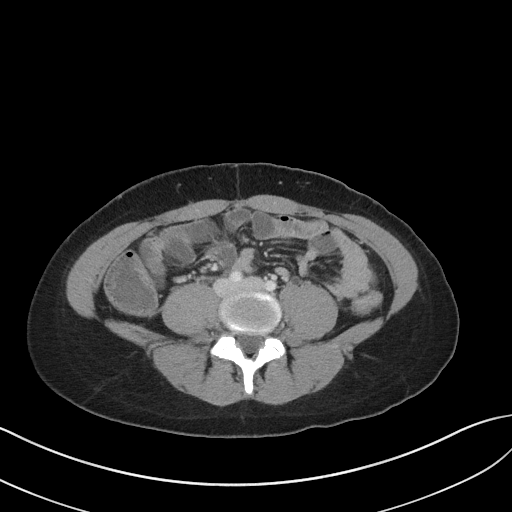
[im 51/95  soft-tissue]
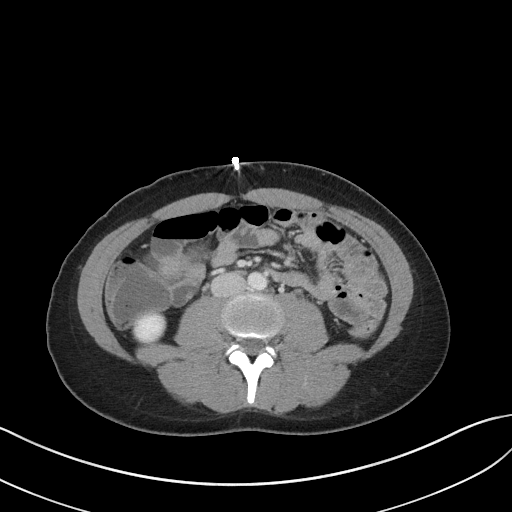
[im 59/95  soft-tissue]
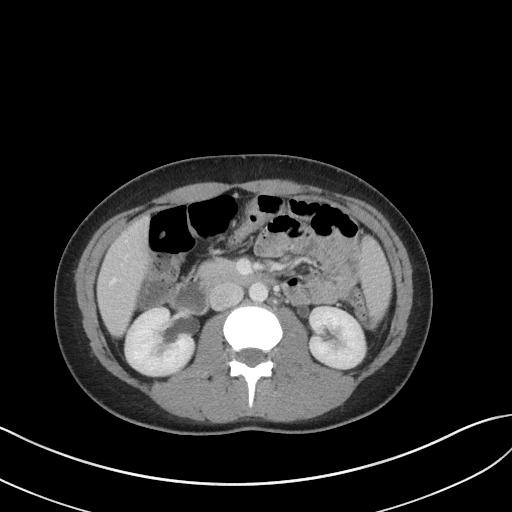
[im 67/95  soft-tissue]
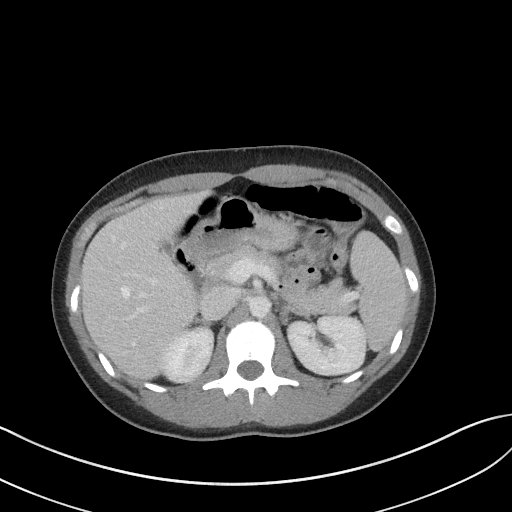
[im 67/95  bone]
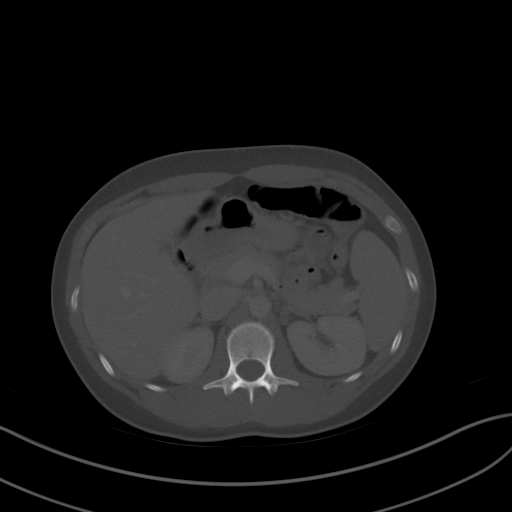
[im 75/95  soft-tissue]
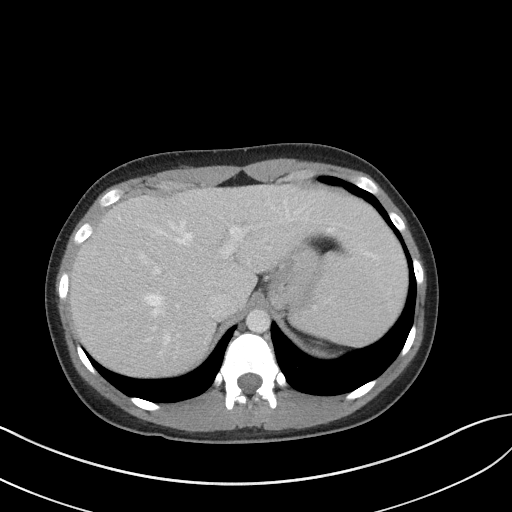
[im 83/95  soft-tissue]
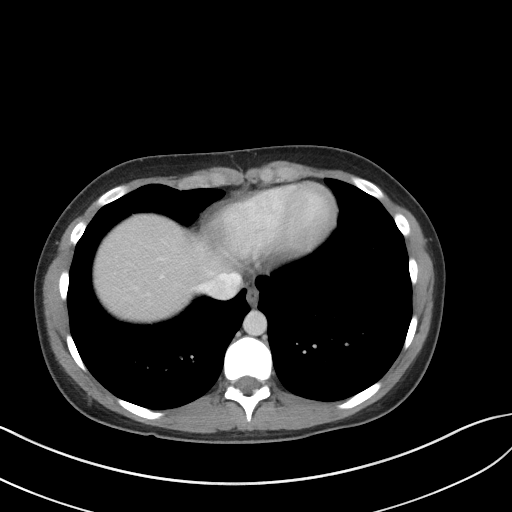
[im 91/95  soft-tissue]
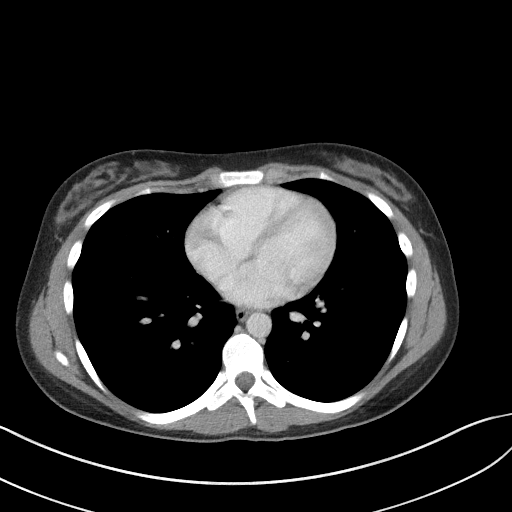

[Series 5: coronal st · coronal · 0.68mm/px · 3 of 75 slices shown]
[im 25/75  soft-tissue]
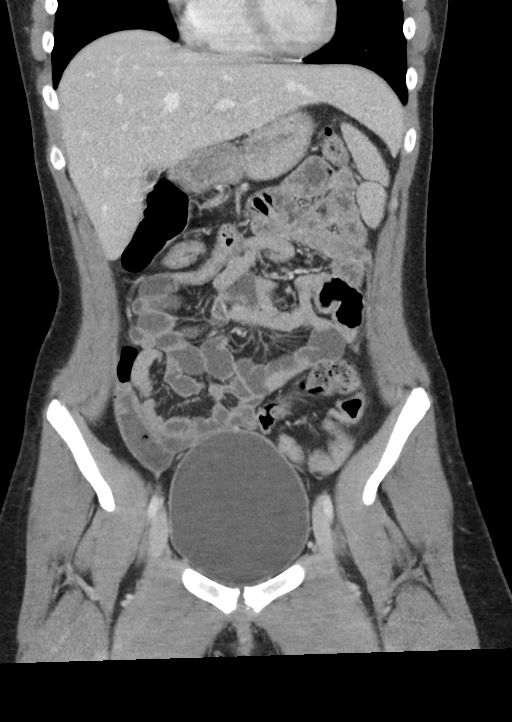
[im 33/75  soft-tissue]
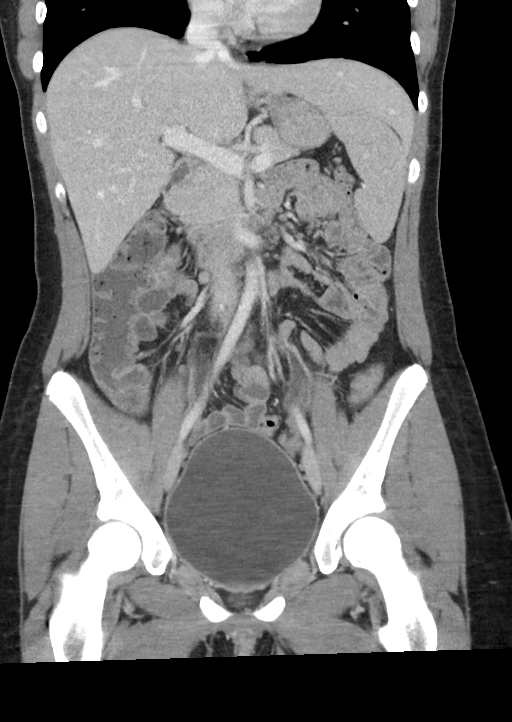
[im 42/75  soft-tissue]
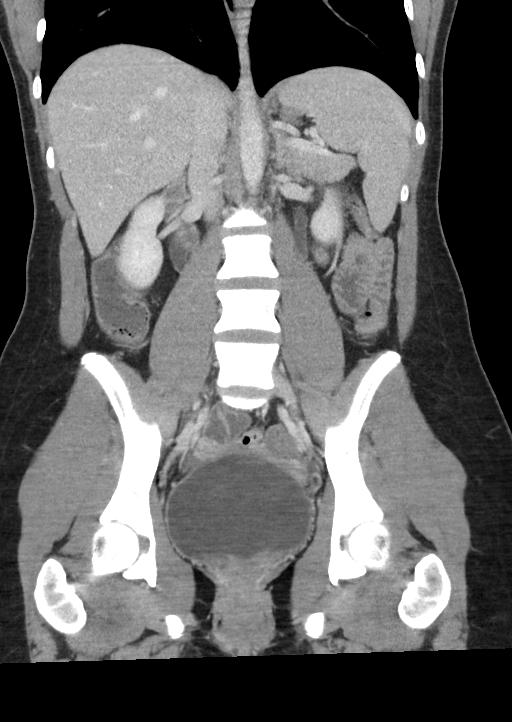

[15 of 46 positions shown; findings below may reference images not displayed]

FINDINGS: Lower chest: No acute abnormality.

Hepatobiliary: No suspicious hepatic lesion. Gallbladder is
decompressed. No biliary ductal dilation.

Pancreas: No pancreatic ductal dilation or evidence of acute
inflammation.

Spleen: Within normal limits.

Adrenals/Urinary Tract: Bilateral adrenal glands are unremarkable.
No suspicious renal lesion. Urinary bladder is distended. Mild
prominence of the ureters and bilateral renal pelves without
caliectasis. Punctate focus of gas in the urinary bladder,
correlation with history of recent instrumentation.

Stomach/Bowel: No enteric contrast was administered. Stomach is
unremarkable for degree of distension. No pathologic dilation of
small or large bowel. The appendix and terminal ileum appear normal.
Gas and fluid in the proximal colon with decompressed distal colon.
Prominent nondilated fluid-filled loops of small bowel in the right
lower quadrant.

Vascular/Lymphatic: No abdominal aortic aneurysm. No pathologically
enlarged abdominopelvic lymph nodes.

Reproductive: Intrauterine device appears appropriate in
positioning. No suspicious adnexal masses.

Other: No abdominopelvic ascites.  No pneumoperitoneum.

Musculoskeletal: No acute or significant osseous findings.
IMPRESSION: 1. Normal appendix.
2. Prominent nondilated fluid-filled loops of small bowel in the
right lower quadrant with fluid-filled proximal colon possibly
reflecting nonspecific enterocolitis.
3. Distended urinary bladder with prominence of the bilateral
ureters and renal pelvises, favored to reflect back pressure from
the distended bladder.
4. Punctate focus of gas in the urinary bladder, correlation with
history of recent instrumentation is recommended.

## 2023-11-06 ENCOUNTER — Encounter
Admit: 2023-11-06 | Payer: PRIVATE HEALTH INSURANCE | Attending: Family Medicine | Primary: Student in an Organized Health Care Education/Training Program

## 2023-11-06 DIAGNOSIS — G43009 Migraine without aura, not intractable, without status migrainosus: Secondary | ICD-10-CM

## 2023-11-09 NOTE — Telephone Encounter
 Pt scheduled as a TH on 3/14

## 2023-11-12 ENCOUNTER — Encounter
Admit: 2023-11-12 | Payer: PRIVATE HEALTH INSURANCE | Attending: Family Medicine | Primary: Student in an Organized Health Care Education/Training Program

## 2023-11-12 MED ORDER — TOPIRAMATE 25 MG TABLET
25 | ORAL_TABLET | Freq: Every evening | ORAL | 4 refills | Status: AC
Start: 2023-11-12 — End: ?

## 2023-11-15 ENCOUNTER — Encounter
Admit: 2023-11-15 | Payer: PRIVATE HEALTH INSURANCE | Attending: Family Medicine | Primary: Student in an Organized Health Care Education/Training Program

## 2023-11-15 DIAGNOSIS — F419 Anxiety disorder, unspecified: Secondary | ICD-10-CM

## 2023-11-15 DIAGNOSIS — F909 Attention-deficit hyperactivity disorder, unspecified type: Secondary | ICD-10-CM

## 2023-11-15 DIAGNOSIS — F32A Depression: Secondary | ICD-10-CM

## 2023-11-15 DIAGNOSIS — R251 Tremor, unspecified: Secondary | ICD-10-CM

## 2023-11-15 DIAGNOSIS — G43109 Migraine with aura, not intractable, without status migrainosus: Secondary | ICD-10-CM

## 2023-11-15 DIAGNOSIS — G43009 Migraine without aura, not intractable, without status migrainosus: Secondary | ICD-10-CM

## 2023-11-15 DIAGNOSIS — G44209 Tension-type headache, unspecified, not intractable: Secondary | ICD-10-CM

## 2023-11-17 ENCOUNTER — Encounter
Admit: 2023-11-17 | Payer: PRIVATE HEALTH INSURANCE | Attending: Family Medicine | Primary: Student in an Organized Health Care Education/Training Program

## 2023-11-17 DIAGNOSIS — G43009 Migraine without aura, not intractable, without status migrainosus: Secondary | ICD-10-CM

## 2023-11-19 ENCOUNTER — Encounter
Admit: 2023-11-19 | Payer: PRIVATE HEALTH INSURANCE | Primary: Student in an Organized Health Care Education/Training Program

## 2023-11-19 MED ORDER — RIMEGEPANT 75 MG DISINTEGRATING TABLET
75 | ORAL_TABLET | Freq: Every evening | ORAL | 5 refills | Status: AC
Start: 2023-11-19 — End: ?

## 2024-01-18 ENCOUNTER — Encounter
Admit: 2024-01-18 | Payer: PRIVATE HEALTH INSURANCE | Primary: Student in an Organized Health Care Education/Training Program

## 2024-02-12 ENCOUNTER — Telehealth
Admit: 2024-02-12 | Payer: PRIVATE HEALTH INSURANCE | Attending: Family Medicine | Primary: Student in an Organized Health Care Education/Training Program

## 2024-02-12 ENCOUNTER — Encounter
Admit: 2024-02-12 | Payer: PRIVATE HEALTH INSURANCE | Primary: Student in an Organized Health Care Education/Training Program

## 2024-02-12 NOTE — Telephone Encounter
 OutcomeApproved today by [employer] 2017CaseId:[id number];Status:Approved;Review Type:Prior Auth;Coverage Start Date:01/13/2024;Coverage End Date:02/11/2025;Effective Date: 5/11/2025Authorization Expiration Date: 6/10/2026DrugNurtec 75MG  dispersible tablets

## 2024-05-16 ENCOUNTER — Encounter
Admit: 2024-05-16 | Payer: PRIVATE HEALTH INSURANCE | Attending: Family Medicine | Primary: Student in an Organized Health Care Education/Training Program

## 2024-05-16 DIAGNOSIS — G43009 Migraine without aura, not intractable, without status migrainosus: Principal | ICD-10-CM

## 2024-05-16 MED ORDER — TOPIRAMATE 25 MG TABLET
25 | ORAL_TABLET | Freq: Every evening | ORAL | 2 refills | 33.00000 days | Status: AC
Start: 2024-05-16 — End: ?

## 2024-05-23 ENCOUNTER — Encounter
Admit: 2024-05-23 | Payer: PRIVATE HEALTH INSURANCE | Attending: Family Medicine | Primary: Student in an Organized Health Care Education/Training Program

## 2024-05-30 ENCOUNTER — Encounter
Admit: 2024-05-30 | Payer: PRIVATE HEALTH INSURANCE | Attending: Family Medicine | Primary: Student in an Organized Health Care Education/Training Program

## 2024-08-18 ENCOUNTER — Encounter
Admit: 2024-08-18 | Payer: PRIVATE HEALTH INSURANCE | Attending: Student in an Organized Health Care Education/Training Program | Primary: Student in an Organized Health Care Education/Training Program

## 2024-09-18 ENCOUNTER — Encounter
Admit: 2024-09-18 | Payer: PRIVATE HEALTH INSURANCE | Attending: Student in an Organized Health Care Education/Training Program | Primary: Student in an Organized Health Care Education/Training Program

## 2024-10-27 ENCOUNTER — Encounter
Admit: 2024-10-27 | Payer: PRIVATE HEALTH INSURANCE | Attending: Student in an Organized Health Care Education/Training Program | Primary: Student in an Organized Health Care Education/Training Program
